# Patient Record
Sex: Male | Born: 1965 | Race: Black or African American | Hispanic: No | Marital: Single | Smoking: Current every day smoker
Health system: Southern US, Community
[De-identification: ages and names within clinical notes are randomized; demographics above are authoritative.]

## PROBLEM LIST (undated history)

## (undated) HISTORY — PX: HERNIA REPAIR: SHX51

---

## 1997-08-13 ENCOUNTER — Emergency Department (HOSPITAL_COMMUNITY): Admission: EM | Admit: 1997-08-13 | Discharge: 1997-08-13 | Payer: Self-pay | Admitting: Emergency Medicine

## 1997-08-15 ENCOUNTER — Emergency Department (HOSPITAL_COMMUNITY): Admission: EM | Admit: 1997-08-15 | Discharge: 1997-08-15 | Payer: Self-pay | Admitting: Emergency Medicine

## 2000-12-31 ENCOUNTER — Emergency Department (HOSPITAL_COMMUNITY): Admission: EM | Admit: 2000-12-31 | Discharge: 2000-12-31 | Payer: Self-pay | Admitting: Emergency Medicine

## 2010-12-10 ENCOUNTER — Emergency Department: Payer: Self-pay | Admitting: Emergency Medicine

## 2010-12-28 ENCOUNTER — Emergency Department: Payer: Self-pay | Admitting: Emergency Medicine

## 2012-05-14 IMAGING — CR DG CHEST 2V
1 series · 2 of 2 positions shown · non-contrast
Comparison: none

REASON FOR EXAM: chest discomfort
COMMENTS:

[Series 1: view not recorded · 0.17mm/px · 2 of 2 slices shown]
[im 1/2]
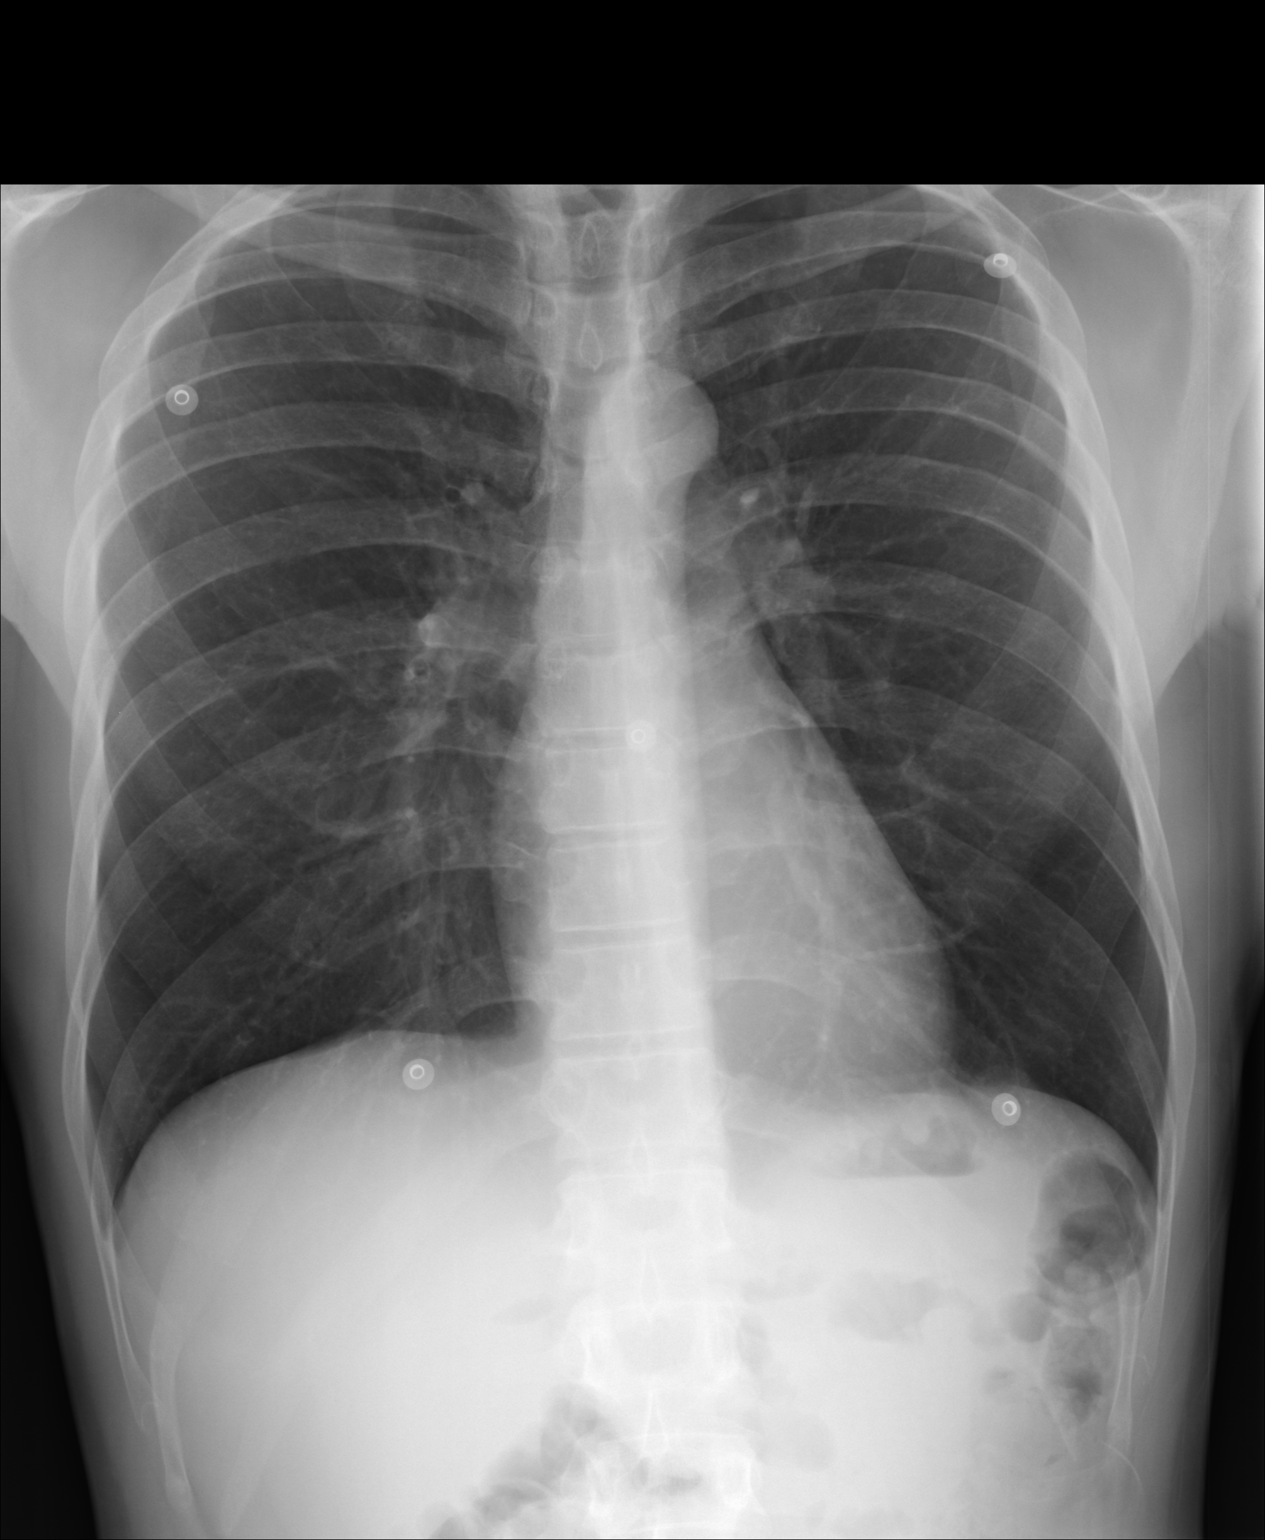
[im 2/2]
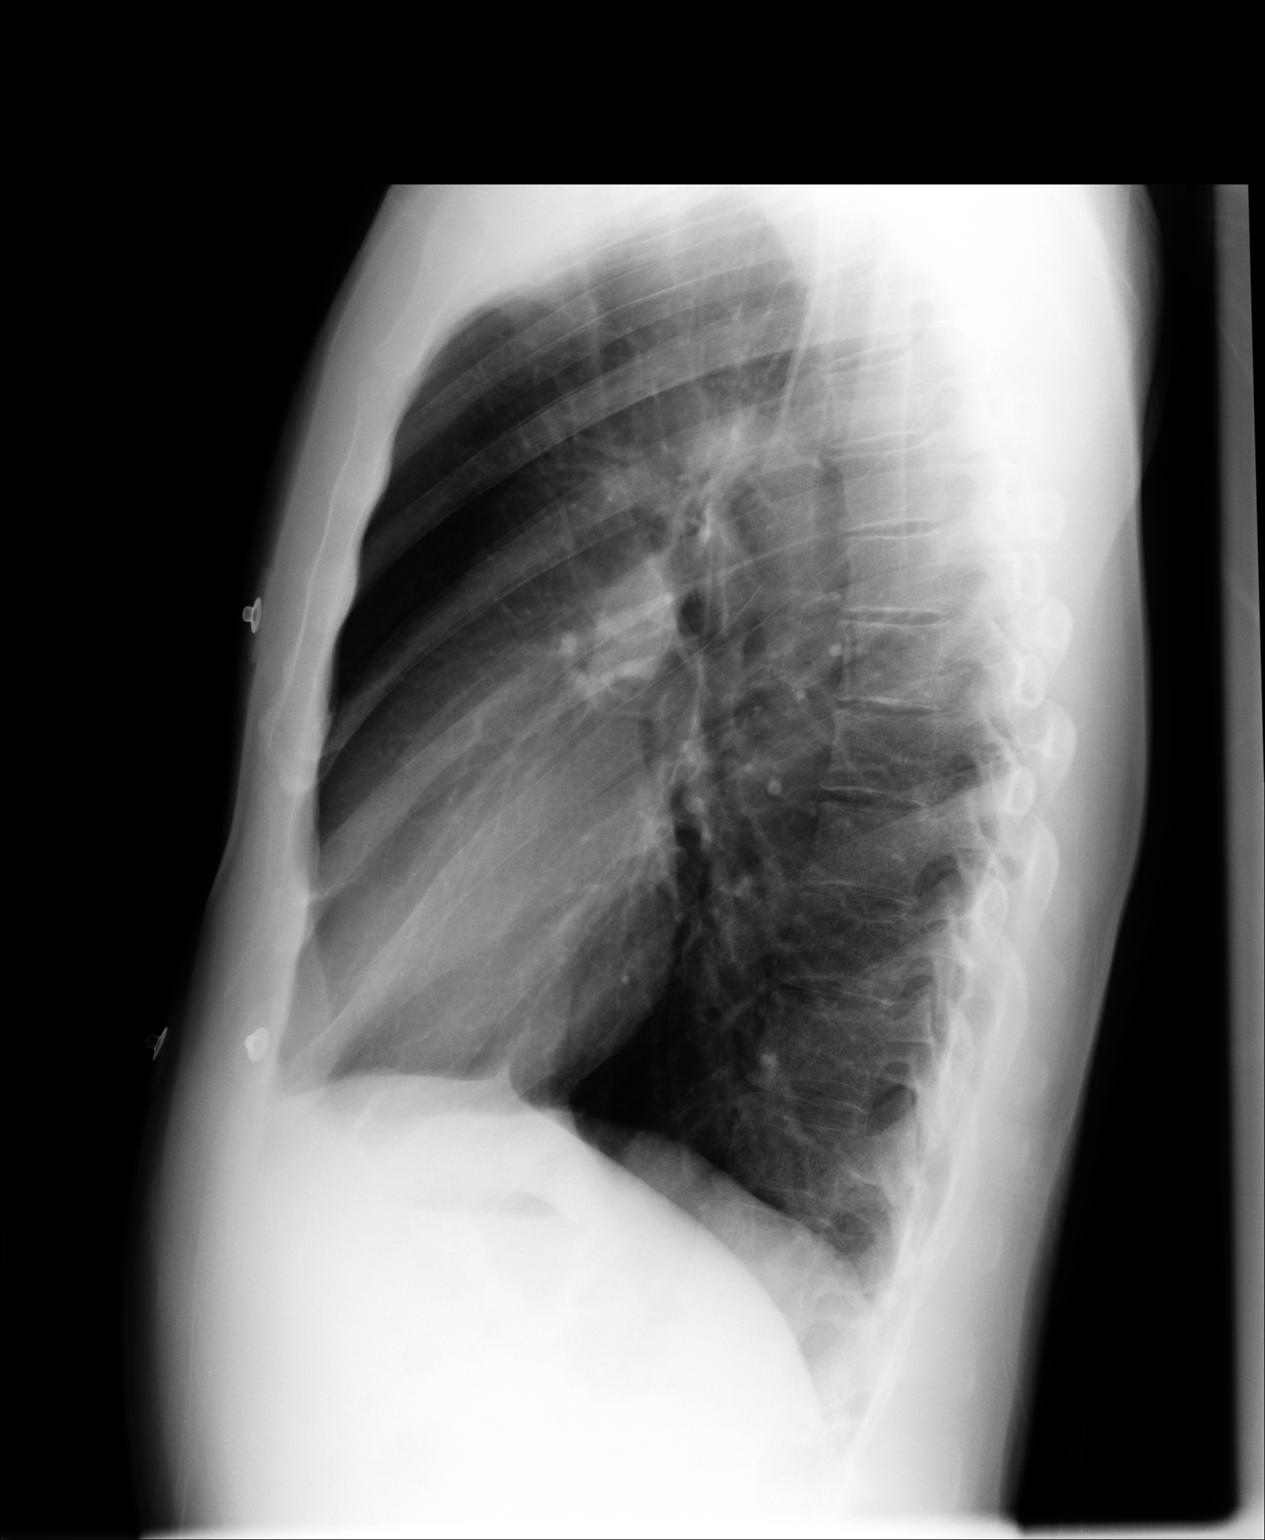

[2 of 2 positions shown; findings below may reference images not displayed]

PROCEDURE:     DXR - DXR CHEST PA (OR AP) AND LATERAL  - December 28, 2010 [DATE]

RESULT:     Comparison is made to the prior exam of 12/10/2010.

The lung fields are clear. The heart, mediastinal and osseous structures
reveal no significant abnormalities. The chest is bilaterally hyperinflated
compatible with a history of COPD.
IMPRESSION: 1.  The lung fields are clear.
2.  COPD.

## 2015-06-27 ENCOUNTER — Encounter: Payer: Self-pay | Admitting: Emergency Medicine

## 2015-06-27 ENCOUNTER — Emergency Department
Admission: EM | Admit: 2015-06-27 | Discharge: 2015-06-27 | Disposition: A | Discharge status: Home / Self Care | Payer: Self-pay | Attending: Emergency Medicine | Admitting: Emergency Medicine

## 2015-06-27 DIAGNOSIS — F17200 Nicotine dependence, unspecified, uncomplicated: Secondary | ICD-10-CM | POA: Insufficient documentation

## 2015-06-27 DIAGNOSIS — J988 Other specified respiratory disorders: Secondary | ICD-10-CM

## 2015-06-27 DIAGNOSIS — B9789 Other viral agents as the cause of diseases classified elsewhere: Secondary | ICD-10-CM

## 2015-06-27 DIAGNOSIS — J069 Acute upper respiratory infection, unspecified: Secondary | ICD-10-CM | POA: Insufficient documentation

## 2015-06-27 MED ORDER — FEXOFENADINE-PSEUDOEPHED ER 60-120 MG PO TB12: 1.0000 | ORAL_TABLET | Freq: Two times a day (BID) | ORAL | Status: DC

## 2015-06-27 MED ORDER — KETOROLAC TROMETHAMINE 60 MG/2ML IM SOLN
60.0000 mg | Freq: Once | INTRAMUSCULAR | Status: AC
  Administered 2015-06-27: 60 mg via INTRAMUSCULAR
  Filled 2015-06-27: qty 2

## 2015-06-27 MED ORDER — IBUPROFEN 800 MG PO TABS: 800.0000 mg | ORAL_TABLET | Freq: Three times a day (TID) | ORAL | Status: DC | PRN

## 2015-06-27 MED ORDER — HYDROCOD POLST-CPM POLST ER 10-8 MG/5ML PO SUER
5.0000 mL | Freq: Once | ORAL | Status: AC
  Administered 2015-06-27: 5 mL via ORAL
  Filled 2015-06-27: qty 5

## 2015-06-27 MED ORDER — HYDROCOD POLST-CPM POLST ER 10-8 MG/5ML PO SUER
5.0000 mL | Freq: Two times a day (BID) | ORAL | Status: DC
Start: 2015-06-27 — End: 2018-10-15

## 2015-06-27 NOTE — ED Notes (Signed)
Fever with body aches and sore throat for 2 days  No diff swallowing at present

## 2015-06-27 NOTE — ED Notes (Signed)
Pt to ed with c/o body aches, fever x 2 days, and sore throat.

## 2015-06-27 NOTE — Discharge Instructions (Signed)

## 2015-06-27 NOTE — ED Provider Notes (Signed)
[facility] Emergency Department Provider Note  ____________________________________________  Time seen: Approximately 10:46 AM  I have reviewed the triage vital signs and the nursing notes.   HISTORY  Chief Complaint Generalized Body Aches    HPI Hunter Clark is a 50 y.o. male patient complaining of fever bodyaches sore throat for 2 days. Patient also states she has sinus congestion and postnasal drainage. Patient denies any nausea vomiting diarrhea with this complaint. Patient state he took a flu shot this season. Patient rates his pain discomfort as a 10 over 10. No palliative measures taken for this complaint.   History reviewed. No pertinent past medical history.  There are no active problems to display for this patient.   History reviewed. No pertinent past surgical history.  Current Outpatient Rx  Name  Route  Sig  Dispense  Refill  . chlorpheniramine-HYDROcodone (TUSSIONEX PENNKINETIC ER) 10-8 MG/5ML SUER   Oral   Take 5 mLs by mouth 2 (two) times daily.   115 mL   0   . fexofenadine-pseudoephedrine (ALLEGRA-D) 60-120 MG 12 hr tablet   Oral   Take 1 tablet by mouth 2 (two) times daily.   20 tablet   0   . ibuprofen (ADVIL,MOTRIN) 800 MG tablet   Oral   Take 1 tablet (800 mg total) by mouth every 8 (eight) hours as needed.   30 tablet   0     Allergies Review of patient's allergies indicates no known allergies.  History reviewed. No pertinent family history.  Social History Social History  Substance Use Topics  . Smoking status: Current Every Day Smoker  . Smokeless tobacco: None  . Alcohol Use: No    Review of Systems Constitutional: Fever, chills, and body aches Eyes: No visual changes. ENT: Sore throat Cardiovascular: Denies chest pain. Respiratory: Denies shortness of breath. Gastrointestinal: No abdominal pain.  No nausea, no vomiting.  No diarrhea.  No constipation. Genitourinary: Negative for  dysuria. Musculoskeletal: Negative for back pain. Skin: Negative for rash. Neurological: Negative for headaches, focal weakness or numbness. 10-point ROS otherwise negative.  ____________________________________________   PHYSICAL EXAM:  VITAL SIGNS: ED Triage Vitals  Enc Vitals Group     BP 06/27/15 1011 120/75 mmHg     Pulse Rate 06/27/15 1011 98     Resp 06/27/15 1011 20     Temp 06/27/15 1011 100.2 F (37.9 C)     Temp Source 06/27/15 1011 Oral     SpO2 06/27/15 1011 100 %     Weight 06/27/15 1011 160 lb (72.576 kg)     Height 06/27/15 1011  (1.676 m)     Head Cir --      Peak Flow --      Pain Score 06/27/15 1011 10     Pain Loc --      Pain Edu? --      Excl. in GC? --     Constitutional: Alert and oriented. Well appearing and in no acute distress. Fever Eyes: Conjunctivae are normal. PERRL. EOMI. Head: Atraumatic. Nose: Edematous nasal turbinates with bilateral maxillary guarding. Mouth/Throat: Mucous membranes are moist.  Oropharynx erythematous non- exudative tonsils Neck: No stridor.  No cervical spine tenderness to palpation. Cardiovascular: Normal rate, regular rhythm. Grossly normal heart sounds.  Good peripheral circulation. Respiratory: Normal respiratory effort.  No retractions. Lungs CTAB. Gastrointestinal: Soft and nontender. No distention. No abdominal bruits. No CVA tenderness. Musculoskeletal: No lower extremity tenderness nor edema.  No joint effusions. Neurologic:  Normal speech and language. No gross focal neurologic deficits are appreciated. No gait instability. Skin:  Skin is warm, dry and intact. No rash noted. Psychiatric: Mood and affect are normal. Speech and behavior are normal.  ____________________________________________   LABS (all labs ordered are listed, but only abnormal results are displayed)  Labs Reviewed - No data to  display ____________________________________________  EKG   ____________________________________________  RADIOLOGY   ____________________________________________   PROCEDURES  Procedure(s) performed: None  Critical Care performed: No  ____________________________________________   INITIAL IMPRESSION / ASSESSMENT AND PLAN / ED COURSE  Pertinent labs & imaging results that were available during my care of the patient were reviewed by me and considered in my medical decision making (see chart for details).  Viral illness. Patient given discharge care instructions. Patient given a prescription for Allegra-D and Tussionex. Patient advised use Tylenol or Motrin for fever and body aches. ____________________________________________   FINAL CLINICAL IMPRESSION(S) / ED DIAGNOSES  Final diagnoses:  Viral respiratory illness      Joni Reining, PA-C 06/27/15 1052  Richardean Canal, MD 06/27/15 [phone]

## 2016-03-24 ENCOUNTER — Emergency Department
Admission: EM | Admit: 2016-03-24 | Discharge: 2016-03-24 | Disposition: A | Discharge status: Home / Self Care | Payer: Self-pay | Attending: Emergency Medicine | Admitting: Emergency Medicine

## 2016-03-24 ENCOUNTER — Encounter: Payer: Self-pay | Admitting: Emergency Medicine

## 2016-03-24 ENCOUNTER — Emergency Department: Payer: Self-pay

## 2016-03-24 DIAGNOSIS — F17200 Nicotine dependence, unspecified, uncomplicated: Secondary | ICD-10-CM | POA: Insufficient documentation

## 2016-03-24 DIAGNOSIS — R0789 Other chest pain: Secondary | ICD-10-CM | POA: Insufficient documentation

## 2016-03-24 DIAGNOSIS — R0600 Dyspnea, unspecified: Secondary | ICD-10-CM | POA: Insufficient documentation

## 2016-03-24 LAB — CBC
HCT: 40.6 % (ref 40.0–52.0)
HEMOGLOBIN: 13.8 g/dL (ref 13.0–18.0)
MCH: 32.8 pg (ref 26.0–34.0)
MCHC: 34 g/dL (ref 32.0–36.0)
MCV: 96.3 fL (ref 80.0–100.0)
PLATELETS: 248 10*3/uL (ref 150–440)
RBC: 4.21 MIL/uL — ABNORMAL LOW (ref 4.40–5.90)
RDW: 13.6 % (ref 11.5–14.5)
WBC: 6.2 10*3/uL (ref 3.8–10.6)

## 2016-03-24 LAB — TROPONIN I

## 2016-03-24 LAB — BASIC METABOLIC PANEL
Anion gap: 6 (ref 5–15)
BUN: 8 mg/dL (ref 6–20)
CALCIUM: 8.5 mg/dL — AB (ref 8.9–10.3)
CO2: 26 mmol/L (ref 22–32)
CREATININE: 1.16 mg/dL (ref 0.61–1.24)
Chloride: 107 mmol/L (ref 101–111)
GFR calc Af Amer: >60 mL/min (ref >60)
GFR calc non Af Amer: >60 mL/min (ref >60)
GLUCOSE: 114 mg/dL — AB (ref 65–99)
Potassium: 3.4 mmol/L — ABNORMAL LOW (ref 3.5–5.1)
Sodium: 139 mmol/L (ref 135–145)

## 2016-03-24 MED ORDER — LORAZEPAM 1 MG PO TABS
1.0000 mg | ORAL_TABLET | Freq: Once | ORAL | Status: AC
  Administered 2016-03-24: 1 mg via ORAL
  Filled 2016-03-24: qty 1

## 2016-03-24 MED ORDER — PREDNISONE 20 MG PO TABS: 40.0000 mg | ORAL_TABLET | Freq: Every day | ORAL | 0 refills | Status: DC

## 2016-03-24 MED ORDER — FAMOTIDINE 20 MG PO TABS
40.0000 mg | ORAL_TABLET | Freq: Once | ORAL | Status: AC
  Administered 2016-03-24: 40 mg via ORAL
  Filled 2016-03-24: qty 2

## 2016-03-24 MED ORDER — NAPROXEN 500 MG PO TABS: 500.0000 mg | ORAL_TABLET | Freq: Two times a day (BID) | ORAL | 0 refills | Status: DC

## 2016-03-24 MED ORDER — RANITIDINE HCL 150 MG PO CAPS: 150.0000 mg | ORAL_CAPSULE | Freq: Two times a day (BID) | ORAL | 0 refills | Status: DC

## 2016-03-24 MED ORDER — PREDNISONE 20 MG PO TABS
40.0000 mg | ORAL_TABLET | ORAL | Status: AC
  Administered 2016-03-24: 40 mg via ORAL
  Filled 2016-03-24: qty 2

## 2016-03-24 MED ORDER — ASPIRIN 81 MG PO CHEW
324.0000 mg | CHEWABLE_TABLET | Freq: Once | ORAL | Status: AC
  Administered 2016-03-24: 324 mg via ORAL
  Filled 2016-03-24: qty 4

## 2016-03-24 MED ORDER — KETOROLAC TROMETHAMINE 30 MG/ML IJ SOLN
15.0000 mg | INTRAMUSCULAR | Status: AC
  Administered 2016-03-24: 15 mg via INTRAVENOUS
  Filled 2016-03-24: qty 1

## 2016-03-24 NOTE — ED Notes (Signed)
Report from previous rn's. Pt states "i feel a lot better." call bell at side. Pt denies needs at this time. Vss.

## 2016-03-24 NOTE — ED Triage Notes (Signed)
Patient reports left chest pain and shortness of breath starting Friday night. Reports nausea and dizziness increasing over the past two days. Patient denies any history of heart disease for self or family. Patient also reports numbness to mouth and left arm. Patient is tearful in triage and states "I can't take a deep breath." Oxygen saturation 99% on room air upon arrival to ED.

## 2016-03-24 NOTE — ED Provider Notes (Signed)
[facility] Emergency Department Provider Note  ____________________________________________  Time seen: Approximately 7:04 PM  I have reviewed the triage vital signs and the nursing notes.   HISTORY  Chief Complaint Chest Pain    HPI Hunter Clark is a 50 y.o. male who complains of intermittent chest pain lasting about 20 seconds at a time in the left chest, aching, associated with shortness of breath and nausea but no vomiting or diaphoresis. It occurs at rest, no aggravating or alleviating factors. Not pleuritic, not exertional. Never had anything like this before. Denies any medical history. Has been occurring intermittently for the past 2 days. 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  No recent travel trauma hospitalizations or surgeries. No history of DVT or PE.   History reviewed. No pertinent past medical history.   There are no active problems to display for this patient.    Past Surgical History:  Procedure Laterality Date  . HERNIA REPAIR       Prior to Admission medications   Medication Sig Start Date End Date Taking? Authorizing Provider  chlorpheniramine-HYDROcodone (TUSSIONEX PENNKINETIC ER) 10-8 MG/5ML SUER Take 5 mLs by mouth 2 (two) times daily. 06/27/15   Joni Reiningonald K Smith, PA-C  fexofenadine-pseudoephedrine (ALLEGRA-D) 60-120 MG 12 hr tablet Take 1 tablet by mouth 2 (two) times daily. 06/27/15   Joni Reiningonald K Smith, PA-C  ibuprofen (ADVIL,MOTRIN) 800 MG tablet Take 1 tablet (800 mg total) by mouth every 8 (eight) hours as needed. 06/27/15   Joni Reiningonald K Smith, PA-C  naproxen (NAPROSYN) 500 MG tablet Take 1 tablet (500 mg total) by mouth 2 (two) times daily with a meal. 03/24/16   Sharman CheekPhillip Previn Jian, MD  predniSONE (DELTASONE) 20 MG tablet Take 2 tablets (40 mg total) by mouth daily. 03/24/16   Sharman CheekPhillip Hazelle Woollard, MD  ranitidine (ZANTAC) 150 MG capsule Take 1 capsule (150 mg total) by mouth 2 (two) times  daily. 03/24/16   Sharman CheekPhillip Mandee Pluta, MD     Allergies Patient has no known allergies.   No family history on file.  Social History Social History  Substance Use Topics  . Smoking status: Current Every Day Smoker    Packs/day: 0.50  . Smokeless tobacco: Never Used  . Alcohol use Yes     Comment: occasional    Review of Systems  Constitutional:   No fever or chills.  ENT:   No sore throat. No rhinorrhea. Cardiovascular:   Positive as above chest pain. Respiratory:   Intermittent dyspnea without cough. Gastrointestinal:   Negative for abdominal pain, vomiting and diarrhea.   Musculoskeletal:   Negative for focal pain or swelling 10-point ROS otherwise negative.  ____________________________________________   PHYSICAL EXAM:  VITAL SIGNS: ED Triage Vitals  Enc Vitals Group     BP 03/24/16 1710 (!) 158/94     Pulse Rate 03/24/16 1710 72     Resp 03/24/16 1710 14     Temp 03/24/16 1710 98.3 F (36.8 C)     Temp Source 03/24/16 1710 Oral     SpO2 03/24/16 1710 99 %     Weight 03/24/16 1711 175 lb (79.4 kg)     Height 03/24/16 1711 5\' 6"  (1.676 m)     Head Circumference --      Peak Flow --      Pain Score 03/24/16 1711 10     Pain Loc --      Pain Edu? --  Excl. in GC? --     Vital signs reviewed, nursing assessments reviewed.   Constitutional:   Alert and oriented. Well appearing and in no distress. Eyes:   No scleral icterus. No conjunctival pallor. PERRL. EOMI.  No nystagmus. ENT   Head:   Normocephalic and atraumatic.   Nose:   No congestion/rhinnorhea. No septal hematoma   Mouth/Throat:   MMM, no pharyngeal erythema. No peritonsillar mass.    Neck:   No stridor. No SubQ emphysema. No meningismus. Hematological/Lymphatic/Immunilogical:   No cervical lymphadenopathy. Cardiovascular:   RRR. Symmetric bilateral radial and DP pulses.  No murmurs.  Respiratory:   Normal respiratory effort without tachypnea nor retractions. Breath sounds are  clear and equal bilaterally. No wheezes/rales/rhonchi.Left chest wall tender which reproduces his pain. Gastrointestinal:   Soft with mild left upper quadrant tenderness. Non distended. There is no CVA tenderness.  No rebound, rigidity, or guarding. Genitourinary:   deferred Musculoskeletal:   Nontender with normal range of motion in all extremities. No joint effusions.  No lower extremity tenderness.  No edema. Neurologic:   Normal speech and language.  CN 2-10 normal. Motor grossly intact. No gross focal neurologic deficits are appreciated.  Skin:    Skin is warm, dry and intact. No rash noted.  No petechiae, purpura, or bullae. No crepitus  ____________________________________________    LABS (pertinent positives/negatives) (all labs ordered are listed, but only abnormal results are displayed) Labs Reviewed  BASIC METABOLIC PANEL - Abnormal; Notable for the following:       Result Value   Potassium 3.4 (*)    Glucose, Bld 114 (*)    Calcium 8.5 (*)    All other components within normal limits  CBC - Abnormal; Notable for the following:    RBC 4.21 (*)    All other components within normal limits  TROPONIN I   ____________________________________________   EKG  Interpreted by me Normal sinus rhythm rate of 73, normal axis and intervals. Normal QRS ST segments. There is an isolated T-wave inversion in lead 3 which is nonspecific. No evidence of right heart strain.  ____________________________________________    RADIOLOGY  Chest x-ray unremarkable  ____________________________________________   PROCEDURES Procedures  ____________________________________________   INITIAL IMPRESSION / ASSESSMENT AND PLAN / ED COURSE  Pertinent labs & imaging results that were available during my care of the patient were reviewed by me and considered in my medical decision making (see chart for details).  Patient not in distress, presents with atypical chest pain, on exam it is  a Reproducible chest wall pain. He seems to have an element of gastritis and chest wall tenderness suggestive of costochondritis. Overall likely a viral illness. 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  EKG chest x-ray and labs are all unremarkable. Symptoms have resolved after giving a few medications to treat his pain including anti-inflammatories, H2 blocker, and oral Ativan. I'll continue him on NSAIDs H2 blocker and prednisone for a few days. Work note provided.Considering the patient's symptoms, medical history, and physical examination today, I have low suspicion for ACS, PE, TAD, pneumothorax, carditis, mediastinitis, pneumonia, CHF, or sepsis.       Clinical Course    ____________________________________________   FINAL CLINICAL IMPRESSION(S) / ED DIAGNOSES  Final diagnoses:  Chest wall pain       Portions of this note were generated with dragon dictation software. Dictation errors may occur despite best attempts at proofreading.    Sharman CheekPhillip Orvetta Danielski, MD 03/24/16 [id number]

## 2016-11-21 ENCOUNTER — Emergency Department
Admission: EM | Admit: 2016-11-21 | Discharge: 2016-11-21 | Disposition: A | Discharge status: Home / Self Care | Payer: Self-pay | Attending: Student in an Organized Health Care Education/Training Program | Admitting: Student in an Organized Health Care Education/Training Program

## 2016-11-21 DIAGNOSIS — R51 Headache: Secondary | ICD-10-CM | POA: Insufficient documentation

## 2016-11-21 DIAGNOSIS — R519 Headache, unspecified: Secondary | ICD-10-CM

## 2016-11-21 DIAGNOSIS — M791 Myalgia, unspecified site: Secondary | ICD-10-CM

## 2016-11-21 DIAGNOSIS — Z79899 Other long term (current) drug therapy: Secondary | ICD-10-CM | POA: Insufficient documentation

## 2016-11-21 DIAGNOSIS — F17200 Nicotine dependence, unspecified, uncomplicated: Secondary | ICD-10-CM | POA: Insufficient documentation

## 2016-11-21 DIAGNOSIS — Z791 Long term (current) use of non-steroidal anti-inflammatories (NSAID): Secondary | ICD-10-CM | POA: Insufficient documentation

## 2016-11-21 LAB — URINALYSIS, COMPLETE (UACMP) WITH MICROSCOPIC
Bilirubin Urine: NEGATIVE
Glucose, UA: NEGATIVE mg/dL
KETONES UR: NEGATIVE mg/dL
Leukocytes, UA: NEGATIVE
Nitrite: NEGATIVE
PROTEIN: NEGATIVE mg/dL
Specific Gravity, Urine: 1.031 — ABNORMAL HIGH (ref 1.005–1.030)
pH: 5 (ref 5.0–8.0)

## 2016-11-21 LAB — CBC WITH DIFFERENTIAL/PLATELET
Basophils Absolute: 0.1 10*3/uL (ref 0–0.1)
Basophils Relative: 1 %
EOS ABS: 0.2 10*3/uL (ref 0–0.7)
EOS PCT: 2 %
HCT: 43 % (ref 40.0–52.0)
HEMOGLOBIN: 14.5 g/dL (ref 13.0–18.0)
LYMPHS ABS: 2.5 10*3/uL (ref 1.0–3.6)
LYMPHS PCT: 31 %
MCH: 32.5 pg (ref 26.0–34.0)
MCHC: 33.7 g/dL (ref 32.0–36.0)
MCV: 96.6 fL (ref 80.0–100.0)
MONOS PCT: 8 %
Monocytes Absolute: 0.6 10*3/uL (ref 0.2–1.0)
Neutro Abs: 4.7 10*3/uL (ref 1.4–6.5)
Neutrophils Relative %: 58 %
PLATELETS: 272 10*3/uL (ref 150–440)
RBC: 4.45 MIL/uL (ref 4.40–5.90)
RDW: 13.4 % (ref 11.5–14.5)
WBC: 8.1 10*3/uL (ref 3.8–10.6)

## 2016-11-21 LAB — COMPREHENSIVE METABOLIC PANEL
ALBUMIN: 4.2 g/dL (ref 3.5–5.0)
ALT: 13 U/L — AB (ref 17–63)
AST: 29 U/L (ref 15–41)
Alkaline Phosphatase: 41 U/L (ref 38–126)
Anion gap: 8 (ref 5–15)
BUN: 10 mg/dL (ref 6–20)
CHLORIDE: 105 mmol/L (ref 101–111)
CO2: 26 mmol/L (ref 22–32)
CREATININE: 1.1 mg/dL (ref 0.61–1.24)
Calcium: 9.1 mg/dL (ref 8.9–10.3)
GFR calc Af Amer: >60 mL/min (ref >60)
Glucose, Bld: 102 mg/dL — ABNORMAL HIGH (ref 65–99)
POTASSIUM: 3.2 mmol/L — AB (ref 3.5–5.1)
SODIUM: 139 mmol/L (ref 135–145)
Total Bilirubin: 0.5 mg/dL (ref 0.3–1.2)
Total Protein: 7.6 g/dL (ref 6.5–8.1)

## 2016-11-21 MED ORDER — DEXAMETHASONE SODIUM PHOSPHATE 10 MG/ML IJ SOLN
10.0000 mg | Freq: Once | INTRAMUSCULAR | Status: AC
  Administered 2016-11-21: 10 mg via INTRAMUSCULAR
  Filled 2016-11-21 (×2): qty 1

## 2016-11-21 MED ORDER — BUTALBITAL-APAP-CAFFEINE 50-325-40 MG PO TABS: 1.0000 | ORAL_TABLET | Freq: Four times a day (QID) | ORAL | 0 refills | Status: AC | PRN

## 2016-11-21 MED ORDER — PROCHLORPERAZINE EDISYLATE 5 MG/ML IJ SOLN
10.0000 mg | Freq: Once | INTRAMUSCULAR | Status: AC
  Administered 2016-11-21: 10 mg via INTRAMUSCULAR
  Filled 2016-11-21 (×2): qty 2

## 2016-11-21 NOTE — Discharge Instructions (Signed)

## 2016-11-21 NOTE — ED Provider Notes (Signed)
[facility] Emergency Department Provider Note    None    (approximate)  I have reviewed the triage vital signs and the nursing notes.   HISTORY  Chief Complaint Headache and Generalized Body Aches    HPI Hunter Clark is a 51 y.o. male presents with 3 days of migrating headache and myalgias. Patient states he's been working long hours at a [employer] and they have been struggling with a heat. Patient was just ago and today but his boss told to call out. Has had a few episodes of loose stool. States he did try to drink water to stay hydrated. States that the headache is been migrating down into his back. No fevers. No chest pain or shortness of breath. No numbness or tingling. Does not worse headache of his life. It was gradual in onset.   No past medical history on file. No family history on file. Past Surgical History:  Procedure Laterality Date  . HERNIA REPAIR     There are no active problems to display for this patient.     Prior to Admission medications   Medication Sig Start Date End Date Taking? Authorizing Provider  chlorpheniramine-HYDROcodone (TUSSIONEX PENNKINETIC ER) 10-8 MG/5ML SUER Take 5 mLs by mouth 2 (two) times daily. 06/27/15   Joni Reining, PA-C  fexofenadine-pseudoephedrine (ALLEGRA-D) 60-120 MG 12 hr tablet Take 1 tablet by mouth 2 (two) times daily. 06/27/15   Joni Reining, PA-C  ibuprofen (ADVIL,MOTRIN) 800 MG tablet Take 1 tablet (800 mg total) by mouth every 8 (eight) hours as needed. 06/27/15   Joni Reining, PA-C  naproxen (NAPROSYN) 500 MG tablet Take 1 tablet (500 mg total) by mouth 2 (two) times daily with a meal. 03/24/16   Sharman Cheek, MD  predniSONE (DELTASONE) 20 MG tablet Take 2 tablets (40 mg total) by mouth daily. 03/24/16   Sharman Cheek, MD  ranitidine (ZANTAC) 150 MG capsule Take 1 capsule (150 mg total) by mouth 2 (two) times daily. 03/24/16   Sharman Cheek, MD    Allergies Patient  has no known allergies.    Social History Social History  Substance Use Topics  . Smoking status: Current Every Day Smoker    Packs/day: 0.50  . Smokeless tobacco: Never Used  . Alcohol use Yes     Comment: occasional    Review of Systems Patient denies headaches, rhinorrhea, blurry vision, numbness, shortness of breath, chest pain, edema, cough, abdominal pain, nausea, vomiting, diarrhea, dysuria, fevers, rashes or hallucinations unless otherwise stated above in HPI. ____________________________________________   PHYSICAL EXAM:  VITAL SIGNS: Vitals:   11/21/16 2010  BP: (!) 147/83  Pulse: 82  Resp: 18  Temp: 98.8 F (37.1 C)    Constitutional: Alert and oriented. Well appearing and in no acute distress. Eyes: Conjunctivae are normal.  Head: Atraumatic. Nose: No congestion/rhinnorhea. Mouth/Throat: Mucous membranes are moist.   Neck: No stridor. Painless ROM.  Cardiovascular: Normal rate, regular rhythm. Grossly normal heart sounds.  Good peripheral circulation. Respiratory: Normal respiratory effort.  No retractions. Lungs CTAB. Gastrointestinal: Soft and nontender. No distention. No abdominal bruits. No CVA tenderness. Genitourinary:  Musculoskeletal: No lower extremity tenderness nor edema.  No joint effusions. Neurologic:  CN- intact.  No facial droop, Normal FNF.  Normal heel to shin.  Sensation intact bilaterally. Normal speech and language. No gross focal neurologic deficits are appreciated. No gait instability. Skin:  Skin is warm, dry and intact. No rash noted. Psychiatric: Mood and affect are  normal. Speech and behavior are normal.  ____________________________________________   LABS (all labs ordered are listed, but only abnormal results are displayed)  Results for orders placed or performed during the hospital encounter of 11/21/16 (from the past 24 hour(s))  Comprehensive metabolic panel     Status: Abnormal   Collection Time: 11/21/16  8:14 PM    Result Value Ref Range   Sodium 139 135 - 145 mmol/L   Potassium 3.2 (L) 3.5 - 5.1 mmol/L   Chloride 105 101 - 111 mmol/L   CO2 26 22 - 32 mmol/L   Glucose, Bld 102 (H) 65 - 99 mg/dL   BUN 10 6 - 20 mg/dL   Creatinine, Ser 1.611.10 0.61 - 1.24 mg/dL   Calcium 9.1 8.9 - 09.610.3 mg/dL   Total Protein 7.6 6.5 - 8.1 g/dL   Albumin 4.2 3.5 - 5.0 g/dL   AST 29 15 - 41 U/L   ALT 13 (L) 17 - 63 U/L   Alkaline Phosphatase 41 38 - 126 U/L   Total Bilirubin 0.5 0.3 - 1.2 mg/dL   GFR calc non Af Amer >60 >60 mL/min   GFR calc Af Amer >60 >60 mL/min   Anion gap 8 5 - 15  CBC with Differential     Status: None   Collection Time: 11/21/16  8:14 PM  Result Value Ref Range   WBC 8.1 3.8 - 10.6 K/uL   RBC 4.45 4.40 - 5.90 MIL/uL   Hemoglobin 14.5 13.0 - 18.0 g/dL   HCT 04.543.0 40.940.0 - 81.152.0 %   MCV 96.6 80.0 - 100.0 fL   MCH 32.5 26.0 - 34.0 pg   MCHC 33.7 32.0 - 36.0 g/dL   RDW 91.413.4 78.211.5 - 95.614.5 %   Platelets 272 150 - 440 K/uL   Neutrophils Relative % 58 %   Neutro Abs 4.7 1.4 - 6.5 K/uL   Lymphocytes Relative 31 %   Lymphs Abs 2.5 1.0 - 3.6 K/uL   Monocytes Relative 8 %   Monocytes Absolute 0.6 0.2 - 1.0 K/uL   Eosinophils Relative 2 %   Eosinophils Absolute 0.2 0 - 0.7 K/uL   Basophils Relative 1 %   Basophils Absolute 0.1 0 - 0.1 K/uL  Urinalysis, Complete w Microscopic     Status: Abnormal   Collection Time: 11/21/16  8:14 PM  Result Value Ref Range   Color, Urine YELLOW (A) YELLOW   APPearance CLEAR (A) CLEAR   Specific Gravity, Urine 1.031 (H) 1.005 - 1.030   pH 5.0 5.0 - 8.0   Glucose, UA NEGATIVE NEGATIVE mg/dL   Hgb urine dipstick SMALL (A) NEGATIVE   Bilirubin Urine NEGATIVE NEGATIVE   Ketones, ur NEGATIVE NEGATIVE mg/dL   Protein, ur NEGATIVE NEGATIVE mg/dL   Nitrite NEGATIVE NEGATIVE   Leukocytes, UA NEGATIVE NEGATIVE   RBC / HPF 0-5 0 - 5 RBC/hpf   WBC, UA 0-5 0 - 5 WBC/hpf   Bacteria, UA RARE (A) NONE SEEN   Squamous Epithelial / LPF 0-5 (A) NONE SEEN   Mucous PRESENT     ____________________________________________  ____________________________________________  RADIOLOGY   ____________________________________________   PROCEDURES  Procedure(s) performed:  Procedures    Critical Care performed: no ____________________________________________   INITIAL IMPRESSION / ASSESSMENT AND PLAN / ED COURSE  Pertinent labs & imaging results that were available during my care of the patient were reviewed by me and considered in my medical decision making (see chart for details).  DDX: tension, dehydration, migraine, cluster  DONTELL MIAN is a 51 y.o. who presents to the ED with with HA for last 3 days. Not worst HA ever. Gradual onset. Denies focal neurologic symptoms. Denies trauma. No fevers or neck pain. No vision loss. Afebrile in ED. VSS. Exam as above. No meningeal signs. No CN, motor, sensory or cerebellar deficits. Temporal arteries palpable and non-tender. Appears well and non-toxic.  Will provide IM medications for symptom control.  Likely tension, non-specific or possible migraine HA.  Possible component of heat illness. Clinical picture is not consistent with ICH, SAH, SDH, EDH, TIA, or CVA. No concern for meningitis or encephalitis. No concern for GCA/Temporal arteritis.  ----------------------------------------- 9:42 PM on 11/21/2016 -----------------------------------------   Pain improved. Repeat neuro exam is again without focal deficit, nuchal rigidity or evidence of meningeal irritation.  Stable to D/C home, follow up with PCP or Neurology if persistent recurrent Has.  Have discussed with the patient and available family all diagnostics and treatments performed thus far and all questions were answered to the best of my ability. The patient demonstrates understanding and agreement with plan.        ____________________________________________   FINAL CLINICAL IMPRESSION(S) / ED DIAGNOSES  Final diagnoses:  Bad headache   Muscle ache      NEW MEDICATIONS STARTED DURING THIS VISIT:  New Prescriptions   No medications on file     Note:  This document was prepared using Dragon voice recognition software and may include unintentional dictation errors.    Willy Eddy, MD 11/21/16 [phone]

## 2016-11-21 NOTE — ED Triage Notes (Signed)
Pt ambulatory to triage in NAD, report headache, general body aches and malaise x 3 days, states has had diarrhea as well.

## 2016-11-21 NOTE — ED Notes (Signed)
Pt verbalized understanding of discharge and instructions. E-signature pad not in working order.

## 2018-10-09 ENCOUNTER — Other Ambulatory Visit: Payer: Self-pay

## 2018-10-09 DIAGNOSIS — Z5321 Procedure and treatment not carried out due to patient leaving prior to being seen by health care provider: Secondary | ICD-10-CM | POA: Insufficient documentation

## 2018-10-09 DIAGNOSIS — R079 Chest pain, unspecified: Secondary | ICD-10-CM | POA: Insufficient documentation

## 2018-10-09 LAB — CBC
HCT: 42.6 % (ref 39.0–52.0)
Hemoglobin: 14.6 g/dL (ref 13.0–17.0)
MCH: 31.7 pg (ref 26.0–34.0)
MCHC: 34.3 g/dL (ref 30.0–36.0)
MCV: 92.4 fL (ref 80.0–100.0)
Platelets: 291 10*3/uL (ref 150–400)
RBC: 4.61 MIL/uL (ref 4.22–5.81)
RDW: 12.8 % (ref 11.5–15.5)
WBC: 8.8 10*3/uL (ref 4.0–10.5)
nRBC: 0 % (ref 0.0–0.2)

## 2018-10-09 LAB — BASIC METABOLIC PANEL
Anion gap: 11 (ref 5–15)
BUN: 17 mg/dL (ref 6–20)
CO2: 24 mmol/L (ref 22–32)
Calcium: 8.9 mg/dL (ref 8.9–10.3)
Chloride: 103 mmol/L (ref 98–111)
Creatinine, Ser: 1.44 mg/dL — ABNORMAL HIGH (ref 0.61–1.24)
GFR calc Af Amer: >60 mL/min (ref >60)
GFR calc non Af Amer: 56 mL/min — ABNORMAL LOW (ref >60)
Glucose, Bld: 105 mg/dL — ABNORMAL HIGH (ref 70–99)
Potassium: 3.7 mmol/L (ref 3.5–5.1)
Sodium: 138 mmol/L (ref 135–145)

## 2018-10-09 LAB — TROPONIN I: Troponin I: <0.03 ng/mL (ref <0.03)

## 2018-10-09 NOTE — ED Triage Notes (Signed)
Patient reports having chest pain off/on for 3 weeks.  Reports pain in his arms has been constant and the pain in his chest is getting worse.

## 2018-10-10 ENCOUNTER — Emergency Department
Admission: EM | Admit: 2018-10-10 | Discharge: 2018-10-10 | Disposition: A | Discharge status: Home / Self Care | Payer: BC Managed Care – PPO | Attending: Emergency Medicine | Admitting: Emergency Medicine

## 2018-10-12 ENCOUNTER — Telehealth: Payer: Self-pay | Admitting: Emergency Medicine

## 2018-10-12 NOTE — Telephone Encounter (Signed)
Called patient due to lwot to inquire about condition and follow up plans. Said call could not be completed.

## 2018-10-15 ENCOUNTER — Encounter: Payer: Self-pay | Admitting: Emergency Medicine

## 2018-10-15 ENCOUNTER — Emergency Department
Admission: EM | Admit: 2018-10-15 | Discharge: 2018-10-15 | Disposition: A | Discharge status: Home / Self Care | Payer: BC Managed Care – PPO | Attending: Emergency Medicine | Admitting: Emergency Medicine

## 2018-10-15 ENCOUNTER — Emergency Department: Payer: BC Managed Care – PPO

## 2018-10-15 ENCOUNTER — Other Ambulatory Visit: Payer: Self-pay

## 2018-10-15 DIAGNOSIS — Y939 Activity, unspecified: Secondary | ICD-10-CM | POA: Diagnosis not present

## 2018-10-15 DIAGNOSIS — M7022 Olecranon bursitis, left elbow: Secondary | ICD-10-CM | POA: Insufficient documentation

## 2018-10-15 DIAGNOSIS — M79602 Pain in left arm: Secondary | ICD-10-CM | POA: Diagnosis present

## 2018-10-15 DIAGNOSIS — F17200 Nicotine dependence, unspecified, uncomplicated: Secondary | ICD-10-CM | POA: Insufficient documentation

## 2018-10-15 MED ORDER — NAPROXEN 500 MG PO TABS: 500.0000 mg | ORAL_TABLET | Freq: Two times a day (BID) | ORAL | Status: DC

## 2018-10-15 MED ORDER — TRAMADOL HCL 50 MG PO TABS: 50.0000 mg | ORAL_TABLET | Freq: Two times a day (BID) | ORAL | 0 refills | Status: DC | PRN

## 2018-10-15 NOTE — ED Provider Notes (Signed)
  [facility] EMERGENCY DEPARTMENT Provider Note   CSN: [id number] Arrival date & time: 10/15/18  0539     History   Chief Complaint Chief Complaint  Patient presents with  . Arm Pain    HPI Hunter Clark is a 53 y.o. male.     HPI Patient presents with left elbow pain and mild edema secondary to contusion by door 2 weeks ago.  Patient states decreased range of motion with extension limited by complaint of pain.  Patient rates his pain as a 6/10.  Patient described pain is "achy/sharp".  Patient is wearing a makeshift sling for comfort. History reviewed. No pertinent past medical history.  There are no active problems to display for this patient.   Past Surgical History:  Procedure Laterality Date  . HERNIA REPAIR          Home Medications    Prior to Admission medications   Not on File    Family History No family history on file.  Social History Social History   Tobacco Use  . Smoking status: Current Every Day Smoker    Packs/day: 0.50  . Smokeless tobacco: Never Used  Substance Use Topics  . Alcohol use: Yes    Comment: occasional  . Drug use: Yes    Types: Marijuana     Allergies   Patient has no known allergies.   Review of Systems Review of Systems   Physical Exam Updated Vital Signs BP (!) 166/97 (BP Location: Right Arm)   Pulse 91   Temp 98.1 F (36.7 C) (Oral)   Resp 18   Ht 5\' 6"  (1.676 m)   Wt 81.6 kg   SpO2 98%   BMI 29.05 kg/m   Physical Exam No obvious deformity to the left elbow.  Patient has moderate guarding with palpation olecranon process.  Patient has decreased range of motion with extension.  Neurovascular intact.  ED Treatments / Results  Labs (all labs ordered are listed, but only abnormal results are displayed) Labs Reviewed - No data to display  EKG    Radiology No results found. No acute findings on x-ray of the left elbow. Procedures Procedures (including critical care time)   Medications Ordered in ED Medications - No data to display   Initial Impression / Assessment and Plan / ED Course  I have reviewed the triage vital signs and the nursing notes.  Pertinent labs & imaging results that were available during my care of the patient were reviewed by me and considered in my medical decision making (see chart for details).        Left elbow pain secondary to olecranon bursitis.  Patient given discharge care instruction advised take medication as directed.  Advised to wear arm sling for 3 to 5 days as needed.  Patient advised follow orthopedic if no improvement in 1 week.  Final Clinical Impressions(s) / ED Diagnoses   Final diagnoses:  None   Olecranon bursitis ED Discharge Orders    None       Hewitt Blade 10/15/18 0809    Carrie Mew, MD 10/15/18 1437

## 2018-10-15 NOTE — ED Notes (Signed)
See triage note  Presents with left elbow pain  States he hit his elbow about 2 weeks ago  States he was using OTC IBU with min to no relief

## 2018-10-15 NOTE — ED Triage Notes (Signed)
Patient ambulatory to triage with steady gait, without difficulty or distress noted, mask in place; pt reports left elbow pain after "hitting it on a door" 2wks ago; pt is accomp by fiance who keeps telling pt "don't let them draw no blood"

## 2018-10-15 NOTE — Discharge Instructions (Signed)
Follow discharge care instructions, wear sling, take medication as directed.

## 2018-11-04 ENCOUNTER — Other Ambulatory Visit: Payer: Self-pay | Admitting: Orthopedic Surgery

## 2018-11-04 DIAGNOSIS — M5412 Radiculopathy, cervical region: Secondary | ICD-10-CM

## 2018-11-14 ENCOUNTER — Ambulatory Visit
Admission: RE | Admit: 2018-11-14 | Discharge: 2018-11-14 | Disposition: A | Discharge status: Home / Self Care | Payer: BC Managed Care – PPO | Source: Ambulatory Visit | Attending: Orthopedic Surgery | Admitting: Orthopedic Surgery

## 2018-11-14 ENCOUNTER — Other Ambulatory Visit: Payer: Self-pay

## 2018-11-14 DIAGNOSIS — M5412 Radiculopathy, cervical region: Secondary | ICD-10-CM | POA: Insufficient documentation

## 2019-02-25 ENCOUNTER — Other Ambulatory Visit: Payer: Self-pay

## 2019-02-25 DIAGNOSIS — Z20822 Contact with and (suspected) exposure to covid-19: Secondary | ICD-10-CM

## 2019-02-27 LAB — NOVEL CORONAVIRUS, NAA: SARS-CoV-2, NAA: NOT DETECTED

## 2019-03-02 ENCOUNTER — Telehealth: Payer: Self-pay | Admitting: General Practice

## 2019-03-02 NOTE — Telephone Encounter (Signed)
Gave patient negative covid test results Patient understood 

## 2019-04-07 ENCOUNTER — Other Ambulatory Visit: Payer: Self-pay

## 2019-04-07 DIAGNOSIS — Z20822 Contact with and (suspected) exposure to covid-19: Secondary | ICD-10-CM

## 2019-04-09 LAB — NOVEL CORONAVIRUS, NAA: SARS-CoV-2, NAA: NOT DETECTED

## 2019-12-02 ENCOUNTER — Encounter: Payer: Self-pay | Admitting: Emergency Medicine

## 2019-12-02 ENCOUNTER — Emergency Department
Admission: EM | Admit: 2019-12-02 | Discharge: 2019-12-02 | Disposition: A | Discharge status: Home / Self Care | Payer: BC Managed Care – PPO | Attending: Emergency Medicine | Admitting: Emergency Medicine

## 2019-12-02 ENCOUNTER — Other Ambulatory Visit: Payer: Self-pay

## 2019-12-02 DIAGNOSIS — Z77098 Contact with and (suspected) exposure to other hazardous, chiefly nonmedicinal, chemicals: Secondary | ICD-10-CM | POA: Insufficient documentation

## 2019-12-02 DIAGNOSIS — F17210 Nicotine dependence, cigarettes, uncomplicated: Secondary | ICD-10-CM | POA: Diagnosis not present

## 2019-12-02 MED ORDER — HYDROCORTISONE 0.5 % EX CREA: 1.0000 "application " | TOPICAL_CREAM | Freq: Two times a day (BID) | CUTANEOUS | 0 refills | Status: AC

## 2019-12-02 NOTE — ED Notes (Signed)
See triage note  Presents post chemical exposure    States he had some Butyl Acrylate spill on to arms and abd  States he was sent thur decon shower for 15 mins PTA

## 2019-12-02 NOTE — ED Provider Notes (Signed)
[facility] Emergency Department Provider Note  ____________________________________________  Time seen: Approximately 10:18 AM  I have reviewed the triage vital signs and the nursing notes.   HISTORY  Chief Complaint Chemical Exposure    HPI Hunter Clark is a 54 y.o. male that presents to the emergency department for evaluation of splash of butyl acrylate to his left forearm and his abdomen.  Patient states that it was in a bay pose where it was not supposed to be and splashed back at him.  He was sent to the [facility] shower for 15 minutes.  He has changed his close.  He states that his forearm still feels burning "like little bees."  He did not inhale any chemical or get any on his face.  He was wearing his uniform and his safety gear.  Incident happened around 830-9:00.  History reviewed. No pertinent past medical history.  There are no problems to display for this patient.   Past Surgical History:  Procedure Laterality Date  . HERNIA REPAIR      Prior to Admission medications   Medication Sig Start Date End Date Taking? Authorizing Provider  gabapentin (NEURONTIN) 300 MG capsule Take 300 mg by mouth 3 (three) times daily.   Yes [provider]  hydrocortisone cream 0.5 % Apply 1 application topically 2 (two) times daily. 12/02/19   Enid Derry, PA-C    Allergies Patient has no known allergies.  History reviewed. No pertinent family history.  Social History Social History   Tobacco Use  . Smoking status: Current Every Day Smoker    Packs/day: 0.50  . Smokeless tobacco: Never Used  Substance Use Topics  . Alcohol use: Yes    Comment: occasional  . Drug use: Yes    Types: Marijuana     Review of Systems  Constitutional: No fever/chills Cardiovascular: No chest pain. Respiratory: No SOB. Gastrointestinal: No abdominal pain.  No nausea, no vomiting.  Musculoskeletal: Negative for musculoskeletal pain. Skin: Negative for  rash, abrasions, lacerations, ecchymosis. Neurological: Negative for headaches   ____________________________________________   PHYSICAL EXAM:  VITAL SIGNS: ED Triage Vitals  Enc Vitals Group     BP 12/02/19 0946 (!) 150/91     Pulse Rate 12/02/19 0946 92     Resp 12/02/19 0946 20     Temp 12/02/19 0946 98.4 F (36.9 C)     Temp Source 12/02/19 0946 Oral     SpO2 12/02/19 0946 99 %     Weight 12/02/19 0949 180 lb (81.6 kg)     Height 12/02/19 0949 5\' 6"  (1.676 m)     Head Circumference --      Peak Flow --      Pain Score 12/02/19 0949 6     Pain Loc --      Pain Edu? --      Excl. in GC? --      Constitutional: Alert and oriented. Well appearing and in no acute distress. Eyes: Conjunctivae are normal. PERRL. EOMI. Head: Atraumatic. ENT:      Ears:      Nose: No congestion/rhinnorhea.      Mouth/Throat: Mucous membranes are moist.  Neck: No stridor.  Cardiovascular: Normal rate, regular rhythm.  Good peripheral circulation. Respiratory: Normal respiratory effort without tachypnea or retractions. Lungs CTAB. Good air entry to the bases with no decreased or absent breath sounds. Gastrointestinal: Bowel sounds 4 quadrants. Soft and nontender to palpation. No guarding or rigidity. No palpable masses. No distention. Musculoskeletal:  Full range of motion to all extremities. No gross deformities appreciated. Neurologic:  Normal speech and language. No gross focal neurologic deficits are appreciated.  Skin:  Skin is warm, dry and intact. No rash noted. Psychiatric: Mood and affect are normal. Speech and behavior are normal. Patient exhibits appropriate insight and judgement.   ____________________________________________   LABS (all labs ordered are listed, but only abnormal results are displayed)  Labs Reviewed - No data to display ____________________________________________  EKG   ____________________________________________  RADIOLOGY   No results  found.  ____________________________________________    PROCEDURES  Procedure(s) performed:    Procedures    Medications - No data to display   ____________________________________________   INITIAL IMPRESSION / ASSESSMENT AND PLAN / ED COURSE  Pertinent labs & imaging results that were available during my care of the patient were reviewed by me and considered in my medical decision making (see chart for details).  Review of the [place] CSRS was performed in accordance of the [facility] prior to dispensing any controlled drugs.   Patient presents to emergency department after a spray of butyl acrylate to his left arm and abdomen.  Patient did not get any chemical to his face or inhale any chemical.  Patient's only current symptom is some residual stinging sensation to his left forearm. No visible rash. Poison control recommends a topical corticosteroid or antihistamine without further treatment.  Patient will be discharged home with prescriptions for hydrocortisone. Patient is to follow up with primary care as directed. Patient is given ED precautions to return to the ED for any worsening or new symptoms.   Hunter Clark was evaluated in Emergency Department on 12/02/2019 for the symptoms described in the history of present illness. He was evaluated in the context of the global COVID-19 pandemic, which necessitated consideration that the patient might be at risk for infection with the SARS-CoV-2 virus that causes COVID-19. Institutional protocols and algorithms that pertain to the evaluation of patients at risk for COVID-19 are in a state of rapid change based on information released by regulatory bodies including the CDC and federal and state organizations. These policies and algorithms were followed during the patient's care in the ED.  ____________________________________________  FINAL CLINICAL IMPRESSION(S) / ED DIAGNOSES  Final diagnoses:  Chemical exposure      NEW MEDICATIONS  STARTED DURING THIS VISIT:  ED Discharge Orders         Ordered    hydrocortisone cream 0.5 %  2 times daily     Discontinue  Reprint     12/02/19 1037              This chart was dictated using voice recognition software/Dragon. Despite best efforts to proofread, errors can occur which can change the meaning. Any change was purely unintentional.    Enid Derry, PA-C 12/02/19 1303    Shaune Pollack, MD 12/04/19 1547

## 2019-12-02 NOTE — ED Triage Notes (Signed)
Pt presents to ED via POV, states was exposed to Butyl Acrylate. Pt states splashed onto L arm and stomach. Pt states after exposure changed his clothes and washed skin with soap and water x 15 mins as recommended. No visible rash or open wounds noted to skin at this time.

## 2019-12-02 NOTE — Discharge Instructions (Signed)
Please take another shower when you get home.  You can apply the hydrocortisone cream to your forearm.  You can also take a dose of Benadryl when you get home.  Please return to the emergency department if you develop any additional or concerning symptoms.

## 2021-07-06 ENCOUNTER — Other Ambulatory Visit: Payer: Self-pay | Admitting: Family Medicine

## 2021-07-06 DIAGNOSIS — G8929 Other chronic pain: Secondary | ICD-10-CM

## 2021-07-06 DIAGNOSIS — G43109 Migraine with aura, not intractable, without status migrainosus: Secondary | ICD-10-CM

## 2021-08-20 ENCOUNTER — Other Ambulatory Visit: Payer: Self-pay | Admitting: Family Medicine

## 2021-08-20 DIAGNOSIS — G8929 Other chronic pain: Secondary | ICD-10-CM

## 2021-08-20 DIAGNOSIS — Z7689 Persons encountering health services in other specified circumstances: Secondary | ICD-10-CM

## 2021-08-20 DIAGNOSIS — G43109 Migraine with aura, not intractable, without status migrainosus: Secondary | ICD-10-CM

## 2022-01-01 ENCOUNTER — Emergency Department: Payer: BC Managed Care – PPO

## 2022-01-01 ENCOUNTER — Other Ambulatory Visit: Payer: Self-pay

## 2022-01-01 ENCOUNTER — Emergency Department
Admission: EM | Admit: 2022-01-01 | Discharge: 2022-01-01 | Disposition: A | Discharge status: Home / Self Care | Payer: Worker's Compensation | Attending: Emergency Medicine | Admitting: Emergency Medicine

## 2022-01-01 DIAGNOSIS — W228XXA Striking against or struck by other objects, initial encounter: Secondary | ICD-10-CM | POA: Diagnosis not present

## 2022-01-01 DIAGNOSIS — S0990XA Unspecified injury of head, initial encounter: Secondary | ICD-10-CM | POA: Insufficient documentation

## 2022-01-01 NOTE — ED Provider Notes (Signed)
   [facility] Provider Note    Event Date/Time   First MD Initiated Contact with Patient 01/01/22 [phone]     (approximate)  History   Chief Complaint: Head Injury  HPI  Hunter Clark is a 56 y.o. male with no significant past medical history who presents to the emergency department after head injury.  According to the patient he was hit in the head by a metal shipping container door.  Patient states that swung up and hitting him in the right side of the head.  Patient next remembers being picked up off the ground does not sure if he passed out.  Patient states this occurred around 4:30 AM.  He was told by his boss to come to the emergency department to get checked out.  Patient denies any weakness or numbness of any arm or leg denies any confusion but still has a moderate headache.  Physical Exam   Triage Vital Signs: ED Triage Vitals  Enc Vitals Group     BP 01/01/22 0851 (!) 140/96     Pulse Rate 01/01/22 0851 76     Resp 01/01/22 0851 18     Temp 01/01/22 0851 98.4 F (36.9 C)     Temp src --      SpO2 01/01/22 0851 97 %     Weight --      Height --      Head Circumference --      Peak Flow --      Pain Score 01/01/22 0850 5     Pain Loc --      Pain Edu? --      Excl. in GC? --     Most recent vital signs: Vitals:   01/01/22 0851  BP: (!) 140/96  Pulse: 76  Resp: 18  Temp: 98.4 F (36.9 C)  SpO2: 97%    General: Awake, no distress.  CV:  Good peripheral perfusion.  Regular rate and rhythm  Resp:  Normal effort.  Equal breath sounds bilaterally.  Abd:  No distention.  Soft, nontender.  No rebound or guarding. Other:  Equal grip strengths, no pronator drift.   ED Results / Procedures / Treatments   RADIOLOGY  I have reviewed and interpreted the CT head images, no concerning finding on my interpretation of the images. CT scan of the head and C-spine are negative for acute abnormality.   MEDICATIONS ORDERED IN ED: Medications -  No data to display   IMPRESSION / MDM / ASSESSMENT AND PLAN / ED COURSE  I reviewed the triage vital signs and the nursing notes.  Patient's presentation is most consistent with acute presentation with potential threat to life or bodily function.  Patient presents emergency department after head injury and possible LOC.  Given the head injury with possible LOC will obtain CT imaging of the head as a precaution.  Overall the patient appears well, reassuring physical exam reassuring neurological exam.  Vital signs are reassuring.  Patient states mild to moderate headache currently.  CT scan is negative.  We will discharge with supportive care Tylenol or ibuprofen as needed.  Patient agreeable to plan of care.  FINAL CLINICAL IMPRESSION(S) / ED DIAGNOSES   Head injury    Note:  This document was prepared using Dragon voice recognition software and may include unintentional dictation errors.   Minna Antis, MD 01/01/22 [phone]

## 2022-01-01 NOTE — ED Triage Notes (Addendum)
Pt comes with c/o right sided head pain from head injury. Pt states metal container fell and hit him in head while at work. Pt states he was just told to come here. Pt does not have any forms.  Pt filled out ineligibility form.

## 2022-01-03 ENCOUNTER — Other Ambulatory Visit: Payer: Self-pay

## 2022-01-03 ENCOUNTER — Emergency Department
Admission: EM | Admit: 2022-01-03 | Discharge: 2022-01-03 | Discharge status: Against Medical Advice | Payer: BC Managed Care – PPO | Attending: Emergency Medicine | Admitting: Emergency Medicine

## 2022-01-03 DIAGNOSIS — W228XXA Striking against or struck by other objects, initial encounter: Secondary | ICD-10-CM | POA: Insufficient documentation

## 2022-01-03 DIAGNOSIS — S0990XA Unspecified injury of head, initial encounter: Secondary | ICD-10-CM | POA: Insufficient documentation

## 2022-01-03 DIAGNOSIS — Z5321 Procedure and treatment not carried out due to patient leaving prior to being seen by health care provider: Secondary | ICD-10-CM | POA: Insufficient documentation

## 2022-01-03 NOTE — ED Notes (Signed)
Pt states he wanted to leave due to the wait time. Pt advised to wait and see provider, pt declined. EDP made aware.

## 2022-01-03 NOTE — ED Triage Notes (Signed)
Pt recently seen here for head injury. Pt has metal container fall on head. Pt states he has taken OTC meds with no relief. Pt states bad headaches now.

## 2023-03-05 ENCOUNTER — Emergency Department: Payer: Managed Care, Other (non HMO)

## 2023-03-05 ENCOUNTER — Other Ambulatory Visit: Payer: Self-pay

## 2023-03-05 ENCOUNTER — Emergency Department: Payer: Self-pay

## 2023-03-05 ENCOUNTER — Encounter: Payer: Self-pay | Admitting: Emergency Medicine

## 2023-03-05 ENCOUNTER — Inpatient Hospital Stay
Admission: EM | Admit: 2023-03-05 | Discharge: 2023-03-07 | DRG: 872 | Disposition: A | Discharge status: Home / Self Care | Payer: Managed Care, Other (non HMO) | Attending: Internal Medicine | Admitting: Internal Medicine

## 2023-03-05 DIAGNOSIS — Z8042 Family history of malignant neoplasm of prostate: Secondary | ICD-10-CM

## 2023-03-05 DIAGNOSIS — Z79899 Other long term (current) drug therapy: Secondary | ICD-10-CM | POA: Diagnosis not present

## 2023-03-05 DIAGNOSIS — D72829 Elevated white blood cell count, unspecified: Secondary | ICD-10-CM | POA: Diagnosis present

## 2023-03-05 DIAGNOSIS — B961 Klebsiella pneumoniae [K. pneumoniae] as the cause of diseases classified elsewhere: Secondary | ICD-10-CM | POA: Diagnosis present

## 2023-03-05 DIAGNOSIS — N12 Tubulo-interstitial nephritis, not specified as acute or chronic: Secondary | ICD-10-CM | POA: Diagnosis present

## 2023-03-05 DIAGNOSIS — R509 Fever, unspecified: Secondary | ICD-10-CM | POA: Diagnosis present

## 2023-03-05 DIAGNOSIS — K5900 Constipation, unspecified: Secondary | ICD-10-CM | POA: Diagnosis present

## 2023-03-05 DIAGNOSIS — N410 Acute prostatitis: Secondary | ICD-10-CM | POA: Diagnosis present

## 2023-03-05 DIAGNOSIS — M549 Dorsalgia, unspecified: Secondary | ICD-10-CM | POA: Diagnosis present

## 2023-03-05 DIAGNOSIS — A419 Sepsis, unspecified organism: Secondary | ICD-10-CM | POA: Diagnosis present

## 2023-03-05 DIAGNOSIS — F17210 Nicotine dependence, cigarettes, uncomplicated: Secondary | ICD-10-CM | POA: Diagnosis present

## 2023-03-05 DIAGNOSIS — Z720 Tobacco use: Secondary | ICD-10-CM | POA: Diagnosis present

## 2023-03-05 DIAGNOSIS — Z1152 Encounter for screening for COVID-19: Secondary | ICD-10-CM

## 2023-03-05 DIAGNOSIS — R519 Headache, unspecified: Secondary | ICD-10-CM | POA: Diagnosis present

## 2023-03-05 LAB — CBC WITH DIFFERENTIAL/PLATELET
Abs Immature Granulocytes: 0.07 10*3/uL (ref 0.00–0.07)
Basophils Absolute: 0.1 10*3/uL (ref 0.0–0.1)
Basophils Relative: 0 %
Eosinophils Absolute: 0.1 10*3/uL (ref 0.0–0.5)
Eosinophils Relative: 1 %
HCT: 42.5 % (ref 39.0–52.0)
Hemoglobin: 14.4 g/dL (ref 13.0–17.0)
Immature Granulocytes: 1 %
Lymphocytes Relative: 11 %
Lymphs Abs: 1.6 10*3/uL (ref 0.7–4.0)
MCH: 31.9 pg (ref 26.0–34.0)
MCHC: 33.9 g/dL (ref 30.0–36.0)
MCV: 94 fL (ref 80.0–100.0)
Monocytes Absolute: 1 10*3/uL (ref 0.1–1.0)
Monocytes Relative: 7 %
Neutro Abs: 11.7 10*3/uL — ABNORMAL HIGH (ref 1.7–7.7)
Neutrophils Relative %: 80 %
Platelets: 261 10*3/uL (ref 150–400)
RBC: 4.52 MIL/uL (ref 4.22–5.81)
RDW: 13.5 % (ref 11.5–15.5)
WBC: 14.5 10*3/uL — ABNORMAL HIGH (ref 4.0–10.5)
nRBC: 0 % (ref 0.0–0.2)

## 2023-03-05 LAB — COMPREHENSIVE METABOLIC PANEL
ALT: 24 U/L (ref 0–44)
AST: 28 U/L (ref 15–41)
Albumin: 3.9 g/dL (ref 3.5–5.0)
Alkaline Phosphatase: 50 U/L (ref 38–126)
Anion gap: 7 (ref 5–15)
BUN: 13 mg/dL (ref 6–20)
CO2: 26 mmol/L (ref 22–32)
Calcium: 8.3 mg/dL — ABNORMAL LOW (ref 8.9–10.3)
Chloride: 100 mmol/L (ref 98–111)
Creatinine, Ser: 1.25 mg/dL — ABNORMAL HIGH (ref 0.61–1.24)
GFR, Estimated: >60 mL/min (ref >60)
Glucose, Bld: 172 mg/dL — ABNORMAL HIGH (ref 70–99)
Potassium: 4 mmol/L (ref 3.5–5.1)
Sodium: 133 mmol/L — ABNORMAL LOW (ref 135–145)
Total Bilirubin: 1 mg/dL (ref 0.3–1.2)
Total Protein: 7.7 g/dL (ref 6.5–8.1)

## 2023-03-05 LAB — URINALYSIS, W/ REFLEX TO CULTURE (INFECTION SUSPECTED)
Bilirubin Urine: NEGATIVE
Glucose, UA: NEGATIVE mg/dL
Ketones, ur: NEGATIVE mg/dL
Nitrite: POSITIVE — AB
Protein, ur: NEGATIVE mg/dL
Specific Gravity, Urine: 1.024 (ref 1.005–1.030)
pH: 5 (ref 5.0–8.0)

## 2023-03-05 LAB — LACTIC ACID, PLASMA: Lactic Acid, Venous: 1.6 mmol/L (ref 0.5–1.9)

## 2023-03-05 MED ORDER — OXYCODONE HCL 5 MG PO TABS
5.0000 mg | ORAL_TABLET | Freq: Four times a day (QID) | ORAL | Status: DC | PRN
  Filled 2023-03-05: qty 1

## 2023-03-05 MED ORDER — MORPHINE SULFATE (PF) 2 MG/ML IV SOLN
2.0000 mg | INTRAVENOUS | Status: DC | PRN
  Administered 2023-03-05 (×2): 2 mg via INTRAVENOUS
  Filled 2023-03-05 (×3): qty 1

## 2023-03-05 MED ORDER — FENTANYL CITRATE PF 50 MCG/ML IJ SOSY: 25.0000 ug | PREFILLED_SYRINGE | INTRAMUSCULAR | Status: DC | PRN

## 2023-03-05 MED ORDER — ACETAMINOPHEN 325 MG PO TABS
650.0000 mg | ORAL_TABLET | Freq: Four times a day (QID) | ORAL | Status: DC | PRN
Start: 2023-03-05 — End: 2023-03-07
  Administered 2023-03-05 – 2023-03-07 (×5): 650 mg via ORAL
  Filled 2023-03-05 (×5): qty 2

## 2023-03-05 MED ORDER — ACETAMINOPHEN 650 MG RE SUPP: 650.0000 mg | Freq: Four times a day (QID) | RECTAL | Status: DC | PRN

## 2023-03-05 MED ORDER — ACETAMINOPHEN 500 MG PO TABS
1000.0000 mg | ORAL_TABLET | Freq: Once | ORAL | Status: AC
  Administered 2023-03-05: 1000 mg via ORAL
  Filled 2023-03-05: qty 2

## 2023-03-05 MED ORDER — MORPHINE SULFATE (PF) 2 MG/ML IV SOLN: 2.0000 mg | INTRAVENOUS | Status: DC | PRN

## 2023-03-05 MED ORDER — ENOXAPARIN SODIUM 60 MG/0.6ML IJ SOSY
45.0000 mg | PREFILLED_SYRINGE | INTRAMUSCULAR | Status: DC
  Administered 2023-03-05 – 2023-03-06 (×2): 45 mg via SUBCUTANEOUS
  Filled 2023-03-05 (×2): qty 0.6

## 2023-03-05 MED ORDER — SODIUM CHLORIDE 0.9 % IV SOLN
2.0000 g | INTRAVENOUS | Status: DC
  Administered 2023-03-05 – 2023-03-07 (×3): 2 g via INTRAVENOUS
  Filled 2023-03-05 (×4): qty 20

## 2023-03-05 MED ORDER — ONDANSETRON HCL 4 MG PO TABS: 4.0000 mg | ORAL_TABLET | Freq: Four times a day (QID) | ORAL | Status: DC | PRN

## 2023-03-05 MED ORDER — ACETAMINOPHEN 325 MG PO TABS
325.0000 mg | ORAL_TABLET | Freq: Once | ORAL | Status: AC
  Administered 2023-03-05: 325 mg via ORAL
  Filled 2023-03-05: qty 1

## 2023-03-05 MED ORDER — LIDOCAINE 5 % EX PTCH: 1.0000 | MEDICATED_PATCH | Freq: Every day | CUTANEOUS | Status: DC | PRN

## 2023-03-05 MED ORDER — ONDANSETRON HCL 4 MG/2ML IJ SOLN
4.0000 mg | Freq: Four times a day (QID) | INTRAMUSCULAR | Status: DC | PRN
  Administered 2023-03-05: 4 mg via INTRAVENOUS
  Filled 2023-03-05: qty 2

## 2023-03-05 MED ORDER — SODIUM CHLORIDE 0.9 % IV SOLN: INTRAVENOUS | Status: AC

## 2023-03-05 MED ORDER — MORPHINE SULFATE (PF) 4 MG/ML IV SOLN
4.0000 mg | Freq: Once | INTRAVENOUS | Status: AC
  Administered 2023-03-05: 4 mg via INTRAVENOUS
  Filled 2023-03-05: qty 1

## 2023-03-05 MED ORDER — SENNOSIDES-DOCUSATE SODIUM 8.6-50 MG PO TABS
1.0000 | ORAL_TABLET | Freq: Every evening | ORAL | Status: DC | PRN
  Administered 2023-03-07: 1 via ORAL
  Filled 2023-03-05: qty 1

## 2023-03-05 MED ORDER — LACTATED RINGERS IV BOLUS
1000.0000 mL | Freq: Once | INTRAVENOUS | Status: AC
  Administered 2023-03-05: 1000 mL via INTRAVENOUS

## 2023-03-05 MED ORDER — NICOTINE 21 MG/24HR TD PT24: 21.0000 mg | MEDICATED_PATCH | Freq: Every day | TRANSDERMAL | Status: DC | PRN

## 2023-03-05 NOTE — Assessment & Plan Note (Signed)
Follow up pending -  chlamydia/gonorrhea, hiv, rpr

## 2023-03-05 NOTE — Sepsis Progress Note (Signed)
Code sepsis protocol is being monitored by eLink

## 2023-03-05 NOTE — Sepsis Progress Note (Signed)
Secure chat to bedside to see if blood cultures were obtained yet. The blood cultures have not populated as obtained in [facility] chart yet.

## 2023-03-05 NOTE — Hospital Course (Signed)
Mr. Hunter Clark is a 57 year old male with no prior medical history, presents emergency department for chief concerns of urinary frequency, and back pain that started since Wednesday.  Vitals in the ED showed temperature 101.2, respiration rate 20, heart rate of 120, blood pressure 106/82, SpO2 90% on room air.  Serum sodium is 133, potassium 4.0, chloride 100, bicarb 26, BUN of 13, serum creatinine 1.25, EGFR greater than 60, nonfasting for glucose 172, WBC 14.5, hemoglobin 14.4, platelets of 261.  Lactic acid was 1.6.  UA was positive for small leukocytes and positive for nitrates.  ED treatment: Ceftriaxone 2 g IV, LR 1 L bolus, acetaminophen 1000 mg p.o. one-time dose, morphine 4 mg IV one-time dose.

## 2023-03-05 NOTE — Assessment & Plan Note (Signed)
Suspect secondary to acute prostatitis

## 2023-03-05 NOTE — Progress Notes (Signed)
PHARMACIST - PHYSICIAN COMMUNICATION  CONCERNING:  Enoxaparin (Lovenox) for DVT Prophylaxis    RECOMMENDATION: Patient was prescribed enoxaprin 40mg  q24 hours for VTE prophylaxis.   Filed Weights   03/05/23 1140  Weight: 95.3 kg (210 lb)    Body mass index is 33.89 kg/m.  Estimated Creatinine Clearance: 73 mL/min (A) (by C-G formula based on SCr of 1.25 mg/dL (H)).   Based on [facility] policy patient is candidate for enoxaparin 0.5mg /kg TBW SQ every 24 hours based on BMI being >30.   DESCRIPTION: Pharmacy has adjusted enoxaparin dose per [facility] policy.  Patient is now receiving enoxaparin 45 mg every 24 hours    Barrie Folk, PharmD Clinical Pharmacist  03/05/2023 3:19 PM

## 2023-03-05 NOTE — Assessment & Plan Note (Signed)
-   As needed nicotine patch ordered for nicotine craving

## 2023-03-05 NOTE — Assessment & Plan Note (Addendum)
Suspect secondary to acute prostatitis Symptomatic support: Lidocaine 1-2 patches daily as needed for back pain; acetaminophen 650 mg p.o./rectal every 6 hours.  For mild pain, fever, headache; oxycodone 5 mg p.o. every 6 hours as needed for moderate pain, 1 day ordered; morphine 2 mg IV every 4 hours as needed for severe pain, 20 hours ordered

## 2023-03-05 NOTE — ED Triage Notes (Signed)
Pt via POV from home. Pt c/o back pain and urinary frequency. Reports that the pain has been going on since Wednesday. Denies injury. Pt is A&OX4 and NAD

## 2023-03-05 NOTE — Assessment & Plan Note (Signed)
Tylenol and/or ibuprofen PRN fevers

## 2023-03-05 NOTE — ED Provider Notes (Signed)
[facility] Provider Note    Event Date/Time   First MD Initiated Contact with Patient 03/05/23 1226     (approximate)   History   Chief Complaint Back Pain   HPI  Hunter Clark is a 57 y.o. male with no significant past medical history who presents to the ED complaining of flank pain.  Patient reports that he has been dealing with increasing sharp pain in his right flank over the past 24 hours.  This been associated with burning and discomfort when he goes to urinate, which she describes as "like a plunger" when he goes to pee.  He denies any fevers and has not had any nausea, vomiting, or diarrhea.  He also denies any cough, chest pain, shortness of breath.  He denies any history of urinary tract infections or kidney stones.     Physical Exam   Triage Vital Signs: ED Triage Vitals  Encounter Vitals Group     BP 03/05/23 1143 106/82     Systolic BP Percentile --      Diastolic BP Percentile --      Pulse Rate 03/05/23 1143 (!) 120     Resp 03/05/23 1143 20     Temp 03/05/23 1143 (!) 101.2 F (38.4 C)     Temp Source 03/05/23 1143 Oral     SpO2 03/05/23 1143 98 %     Weight 03/05/23 1140 210 lb (95.3 kg)     Height 03/05/23 1140 5\' 6"  (1.676 m)     Head Circumference --      Peak Flow --      Pain Score --      Pain Loc --      Pain Education --      Exclude from Growth Chart --     Most recent vital signs: Vitals:   03/05/23 1339 03/05/23 1400  BP:  118/74  Pulse:  99  Resp: (!) 23 20  Temp:    SpO2:  95%    Constitutional: Alert and oriented. Eyes: Conjunctivae are normal. Head: Atraumatic. Nose: No congestion/rhinnorhea. Mouth/Throat: Mucous membranes are moist.  Cardiovascular: Tachycardic, regular rhythm. Grossly normal heart sounds.  2+ radial pulses bilaterally. Respiratory: Normal respiratory effort.  No retractions. Lungs CTAB. Gastrointestinal: Soft and nontender.  Right CVA tenderness noted.  No  distention. Musculoskeletal: No lower extremity tenderness nor edema.  Neurologic:  Normal speech and language. No gross focal neurologic deficits are appreciated.    ED Results / Procedures / Treatments   Labs (all labs ordered are listed, but only abnormal results are displayed) Labs Reviewed  COMPREHENSIVE METABOLIC PANEL - Abnormal; Notable for the following components:      Result Value   Sodium 133 (*)    Glucose, Bld 172 (*)    Creatinine, Ser 1.25 (*)    Calcium 8.3 (*)    All other components within normal limits  CBC WITH DIFFERENTIAL/PLATELET - Abnormal; Notable for the following components:   WBC 14.5 (*)    Neutro Abs 11.7 (*)    All other components within normal limits  URINALYSIS, W/ REFLEX TO CULTURE (INFECTION SUSPECTED) - Abnormal; Notable for the following components:   Color, Urine YELLOW (*)    APPearance HAZY (*)    Hgb urine dipstick SMALL (*)    Nitrite POSITIVE (*)    Leukocytes,Ua SMALL (*)    Bacteria, UA RARE (*)    All other components within normal limits  URINE CULTURE  CULTURE,  BLOOD (ROUTINE X 2)  CULTURE, BLOOD (ROUTINE X 2)  LACTIC ACID, PLASMA   RADIOLOGY CT renal protocol reviewed and interpreted by me with no kidney stone or hydronephrosis.  PROCEDURES:  Critical Care performed: No  Procedures   MEDICATIONS ORDERED IN ED: Medications  cefTRIAXone (ROCEPHIN) 2 g in sodium chloride 0.9 % 100 mL IVPB (0 g Intravenous Stopped 03/05/23 1338)  lactated ringers bolus 1,000 mL (1,000 mLs Intravenous New Bag/Given 03/05/23 1254)  acetaminophen (TYLENOL) tablet 1,000 mg (1,000 mg Oral Given 03/05/23 1309)  morphine (PF) 4 MG/ML injection 4 mg (4 mg Intravenous Given 03/05/23 1255)     IMPRESSION / MDM / ASSESSMENT AND PLAN / ED COURSE  I reviewed the triage vital signs and the nursing notes.                              57 y.o. male with no significant past medical history who presents to the ED complaining of increasing pain  around his right flank for the past 24 hours with dysuria.  Patient's presentation is most consistent with acute presentation with potential threat to life or bodily function.  Differential diagnosis includes, but is not limited to, pulm nephritis, cystitis, sepsis, kidney stone, cholecystitis, biliary colic, electrolyte abnormality, AKI.  Patient uncomfortable but nontoxic-appearing and in no acute distress, vital signs remarkable for fever and tachycardia, otherwise reassuring.  Patient with right CVA tenderness on exam, no abdominal tenderness noted.  He does meet sepsis criteria and urinalysis concerning for infection, urine was sent for culture.  We will initiate sepsis workup, hydrate with IV fluids, and treat with IV antibiotics.  We will also check CT renal protocol to ensure no contributing kidney stone or other obstructive process.  We will treat pain with IV morphine and reassess following imaging.  Labs show leukocytosis with no significant anemia, electrolyte abnormality, or AKI.  Lactic acid within normal limits.  CT renal protocol unremarkable by my read, radiology read is pending at this time.  Patient reports feeling better following symptomatic treatment, case discussed with hospitalist for admission.      FINAL CLINICAL IMPRESSION(S) / ED DIAGNOSES   Final diagnoses:  Sepsis without acute organ dysfunction, due to unspecified organism [facility])  Pyelonephritis     Rx / DC Orders   ED Discharge Orders     None        Note:  This document was prepared using Dragon voice recognition software and may include unintentional dictation errors.   Chesley Noon, MD 03/05/23 [phone]

## 2023-03-05 NOTE — H&P (Signed)
History and Physical   Hunter Clark WUX:[id number] DOB: 1965-10-08 DOA: 03/05/2023  PCP: [facility]  Patient coming from: home via pov  I have personally briefly reviewed patient's old medical records in [facility] EMR.  Chief Concern: back pain, fever, difficulty urinat  HPI: Hunter Clark is a 57 year old male with no prior medical history, presents emergency department for chief concerns of urinary frequency, and back pain that started since Wednesday.  Vitals in the ED showed temperature 101.2, respiration rate 20, heart rate of 120, blood pressure 106/82, SpO2 90% on room air.  Serum sodium is 133, potassium 4.0, chloride 100, bicarb 26, BUN of 13, serum creatinine 1.25, EGFR greater than 60, nonfasting for glucose 172, WBC 14.5, hemoglobin 14.4, platelets of 261.  Lactic acid was 1.6.  UA was positive for small leukocytes and positive for nitrates.  ED treatment: Ceftriaxone 2 g IV, LR 1 L bolus, acetaminophen 1000 mg p.o. one-time dose, morphine 4 mg IV one-time dose. ----------------------------- At bedside, he was able to tell me his name, age, current location, current calendar year.   He report the back pain and stop/start urination stated yesterday. He endorses dysuria and denies hematuria. He reprots his urine has been darker and denies blood. He denies diarrhea. He endorses constipation.   He denies nausea, vomiting.   He reports he has not had a constipation.  Social history: He lives with his girlfriend. He smokes 5 cigarettes per day. He infrequently drinks etoh, last drink was two months ago. He denies IV drug use. He works in the a Location manager.   ROS: Constitutional: no weight change, no fever ENT/Mouth: no sore throat, no rhinorrhea Eyes: no eye pain, no vision changes Cardiovascular: no chest pain, no dyspnea,  no edema, no palpitations Respiratory: no cough, no sputum, no wheezing Gastrointestinal: no nausea, no vomiting,  no diarrhea, + constipation Genitourinary: no urinary incontinence, + dysuria, no hematuria Musculoskeletal: no arthralgias, no myalgias, + back pain Skin: no skin lesions, no pruritus, Neuro: + weakness, no loss of consciousness, no syncope Psych: no anxiety, no depression, no decrease appetite Heme/Lymph: no bruising, no bleeding  ED Course: Discussed with edp, patient requiring hospitalization for chief concern of   Assessment/Plan  Principal Problem:   Pyelonephritis Active Problems:   Leukocytosis   Fever   Back pain   Prostatitis, acute   Tobacco use   Assessment and Plan:  * Pyelonephritis Acute prostatitis Urine culture is in process Blood cultures x 2 have been ordered Continue with ceftriaxone 2 g IV daily, 7 days ordered Admit to telemetry medical, inpatient  Tobacco use As needed nicotine patch ordered for nicotine craving  Prostatitis, acute Check chlamydia/gonorrhea, hiv, rpr  Back pain Suspect secondary to acute prostatitis Symptomatic support: Lidocaine 1-2 patches daily as needed for back pain; acetaminophen 650 mg p.o./rectal every 6 hours.  For mild pain, fever, headache; oxycodone 5 mg p.o. every 6 hours as needed for moderate pain, 1 day ordered; morphine 2 mg IV every 4 hours as needed for severe pain, 20 hours ordered  Fever Improved with acetaminophen  Leukocytosis Suspect secondary to acute prostatitis  Health maintenance - counseled patient on healthier living, decreasing stress, healthy exercise, getting annual physical exam, and colonoscopies. Patient endorses understanding and compliance.  Chart reviewed.   DVT prophylaxis: Enoxaparin Code Status: full code Diet: Regular diet Family Communication: a phone call was offered, patient declined, stating girlfriend is updated Disposition Plan: Pending clinical course Consults called: None  at this time Admission status: Telemetry medical, inpatient  History reviewed. No pertinent past  medical history. Past Surgical History:  Procedure Laterality Date   HERNIA REPAIR     Social History:  reports that he has been smoking cigarettes. He has never used smokeless tobacco. He reports current alcohol use. He reports current drug use. Drug: Marijuana.  No Known Allergies Family History  Problem Relation Age of Onset   Prostate cancer Maternal Grandfather    Family history: Family history reviewed and not pertinent.  Prior to Admission medications   Medication Sig Start Date End Date Taking? Authorizing Provider  gabapentin (NEURONTIN) 300 MG capsule Take 300 mg by mouth 3 (three) times daily.    [provider]  hydrocortisone cream 0.5 % Apply 1 application topically 2 (two) times daily. 12/02/19   Enid Derry, PA-C   Physical Exam: Vitals:   03/05/23 1544 03/05/23 1600 03/05/23 1640 03/05/23 1641  BP:  107/75 123/85   Pulse: 90 91 93 95  Resp: 19 16 18 17   Temp:    98.2 F (36.8 C)  TempSrc:    Oral  SpO2: 100% 100% 100% 100%  Weight:      Height:       Constitutional: appears age appropriate, NAD, calm Eyes: PERRL, lids and conjunctivae normal ENMT: Mucous membranes are moist. Posterior pharynx clear of any exudate or lesions. Age-appropriate dentition. Hearing appropriate Neck: normal, supple, no masses, no thyromegaly Respiratory: clear to auscultation bilaterally, no wheezing, no crackles. Normal respiratory effort. No accessory muscle use.  Cardiovascular: Regular rate and rhythm, no murmurs / rubs / gallops. No extremity edema. 2+ pedal pulses. No carotid bruits.  Abdomen: no tenderness, no masses palpated, no hepatosplenomegaly. Bowel sounds positive.  Musculoskeletal: no clubbing / cyanosis. No joint deformity upper and lower extremities. Good ROM, no contractures, no atrophy. Normal muscle tone.  Skin: no rashes, lesions, ulcers. No induration Neurologic: Sensation intact. Strength 5/5 in all 4.  Psychiatric: Normal judgment and insight.  Alert and oriented x 3. Normal mood.   EKG: Not indicated at this time  Chest x-ray on Admission: not indicated at this time  CT Renal Stone Study  Result Date: 03/05/2023 CLINICAL DATA:  Back pain and urinary frequency. EXAM: CT ABDOMEN AND PELVIS WITHOUT CONTRAST TECHNIQUE: Multidetector CT imaging of the abdomen and pelvis was performed following the standard protocol without IV contrast. RADIATION DOSE REDUCTION: This exam was performed according to the departmental dose-optimization program which includes automated exposure control, adjustment of the mA and/or kV according to patient size and/or use of iterative reconstruction technique. COMPARISON:  None Available. FINDINGS: Lower chest: The lung bases are clear. The imaged heart is unremarkable. Hepatobiliary: The liver and gallbladder are unremarkable. There is no biliary ductal dilatation. Pancreas: Unremarkable. Spleen: Unremarkable. Adrenals/Urinary Tract: The adrenals are unremarkable. The kidneys are unremarkable, with no focal lesion, stone, hydronephrosis, or hydroureter. The bladder is unremarkable. Stomach/Bowel: The stomach is unremarkable. There is no evidence of bowel obstruction. There is no abnormal bowel wall thickening or inflammatory change. The appendix is normal. Vascular/Lymphatic: The abdominal aorta is normal in course and caliber. There is no abdominal or pelvic lymphadenopathy. Reproductive: There is mild fat stranding around the prostate and seminal vesicles. Other: There is no ascites or free air. Musculoskeletal: There is no acute osseous abnormality or suspicious osseous lesion. IMPRESSION: 1. Mild stranding around the prostate and seminal vesicles suggestive of acute prostatitis. 2. No other acute finding in the abdomen or pelvis. Electronically Signed  By: Lesia Hausen M.D.   On: 03/05/2023 15:56    Labs on Admission: I have personally reviewed following labs  CBC: Recent Labs  Lab 03/05/23 1148  WBC 14.5*   NEUTROABS 11.7*  HGB 14.4  HCT 42.5  MCV 94.0  PLT 261   Basic Metabolic Panel: Recent Labs  Lab 03/05/23 1148  NA 133*  K 4.0  CL 100  CO2 26  GLUCOSE 172*  BUN 13  CREATININE 1.25*  CALCIUM 8.3*   GFR: Estimated Creatinine Clearance: 73 mL/min (A) (by C-G formula based on SCr of 1.25 mg/dL (H)).  Liver Function Tests: Recent Labs  Lab 03/05/23 1148  AST 28  ALT 24  ALKPHOS 50  BILITOT 1.0  PROT 7.7  ALBUMIN 3.9   Urine analysis:    Component Value Date/Time   COLORURINE YELLOW (A) 03/05/2023 1148   APPEARANCEUR HAZY (A) 03/05/2023 1148   LABSPEC 1.024 03/05/2023 1148   PHURINE 5.0 03/05/2023 1148   GLUCOSEU NEGATIVE 03/05/2023 1148   HGBUR SMALL (A) 03/05/2023 1148   BILIRUBINUR NEGATIVE 03/05/2023 1148   KETONESUR NEGATIVE 03/05/2023 1148   PROTEINUR NEGATIVE 03/05/2023 1148   NITRITE POSITIVE (A) 03/05/2023 1148   LEUKOCYTESUR SMALL (A) 03/05/2023 1148   This document was prepared using Dragon Voice Recognition software and may include unintentional dictation errors.  Dr. Sedalia Muta [facility]  If 7PM-7AM, please contact overnight-coverage provider If 7AM-7PM, please contact day attending provider [email or web address]  03/05/2023, 6:32 PM

## 2023-03-05 NOTE — Plan of Care (Signed)

## 2023-03-05 NOTE — Assessment & Plan Note (Addendum)
Acute prostatitis Urine culture +Klebsiellla Initially treated with IV Rocephin Transition to PO Augmentin to complete 10 day course (longer duration given UTI complicated by proctitis) Repeat imaging if ongoing fevers or escalating flank pain Pyridium for dysuria

## 2023-03-05 NOTE — Consult Note (Signed)
CODE SEPSIS - PHARMACY COMMUNICATION  **Broad Spectrum Antibiotics should be administered within 1 hour of Sepsis diagnosis**  Time Code Sepsis Called/Page Received: 1236  Antibiotics Ordered: ceftriaxone  Time of 1st antibiotic administration: 1252  Additional action taken by pharmacy: n/a  If necessary, Name of Provider/Nurse Contacted: n/a    Bettey Costa ,PharmD Clinical Pharmacist  03/05/2023  12:38 PM

## 2023-03-06 DIAGNOSIS — N12 Tubulo-interstitial nephritis, not specified as acute or chronic: Secondary | ICD-10-CM | POA: Diagnosis not present

## 2023-03-06 DIAGNOSIS — A419 Sepsis, unspecified organism: Secondary | ICD-10-CM | POA: Diagnosis present

## 2023-03-06 LAB — CBC
HCT: 41.4 % (ref 39.0–52.0)
Hemoglobin: 13.9 g/dL (ref 13.0–17.0)
MCH: 31.4 pg (ref 26.0–34.0)
MCHC: 33.6 g/dL (ref 30.0–36.0)
MCV: 93.5 fL (ref 80.0–100.0)
Platelets: 225 10*3/uL (ref 150–400)
RBC: 4.43 MIL/uL (ref 4.22–5.81)
RDW: 13.6 % (ref 11.5–15.5)
WBC: 11.2 10*3/uL — ABNORMAL HIGH (ref 4.0–10.5)
nRBC: 0 % (ref 0.0–0.2)

## 2023-03-06 LAB — HIV ANTIBODY (ROUTINE TESTING W REFLEX): HIV Screen 4th Generation wRfx: NONREACTIVE

## 2023-03-06 LAB — BASIC METABOLIC PANEL
Anion gap: 10 (ref 5–15)
BUN: 13 mg/dL (ref 6–20)
CO2: 25 mmol/L (ref 22–32)
Calcium: 8.3 mg/dL — ABNORMAL LOW (ref 8.9–10.3)
Chloride: 99 mmol/L (ref 98–111)
Creatinine, Ser: 1.13 mg/dL (ref 0.61–1.24)
GFR, Estimated: >60 mL/min (ref >60)
Glucose, Bld: 146 mg/dL — ABNORMAL HIGH (ref 70–99)
Potassium: 4.5 mmol/L (ref 3.5–5.1)
Sodium: 134 mmol/L — ABNORMAL LOW (ref 135–145)

## 2023-03-06 LAB — RPR: RPR Ser Ql: NONREACTIVE

## 2023-03-06 LAB — SARS CORONAVIRUS 2 BY RT PCR: SARS Coronavirus 2 by RT PCR: NEGATIVE

## 2023-03-06 MED ORDER — MORPHINE SULFATE (PF) 2 MG/ML IV SOLN: 2.0000 mg | INTRAVENOUS | Status: DC | PRN

## 2023-03-06 MED ORDER — PHENAZOPYRIDINE HCL 200 MG PO TABS
200.0000 mg | ORAL_TABLET | Freq: Three times a day (TID) | ORAL | Status: DC
  Administered 2023-03-06 – 2023-03-07 (×5): 200 mg via ORAL
  Filled 2023-03-06 (×5): qty 1

## 2023-03-06 MED ORDER — OXYCODONE HCL 5 MG PO TABS
5.0000 mg | ORAL_TABLET | ORAL | Status: DC | PRN
  Administered 2023-03-06 – 2023-03-07 (×2): 10 mg via ORAL
  Administered 2023-03-07 (×2): 5 mg via ORAL
  Filled 2023-03-06: qty 1
  Filled 2023-03-06: qty 2
  Filled 2023-03-06: qty 1
  Filled 2023-03-06: qty 2

## 2023-03-06 MED ORDER — IBUPROFEN 400 MG PO TABS
400.0000 mg | ORAL_TABLET | Freq: Four times a day (QID) | ORAL | Status: DC | PRN
  Administered 2023-03-06: 400 mg via ORAL
  Filled 2023-03-06: qty 1

## 2023-03-06 MED ORDER — DIPHENHYDRAMINE HCL 50 MG/ML IJ SOLN
12.5000 mg | Freq: Four times a day (QID) | INTRAMUSCULAR | Status: DC | PRN
  Administered 2023-03-06 – 2023-03-07 (×2): 12.5 mg via INTRAVENOUS
  Filled 2023-03-06 (×2): qty 1

## 2023-03-06 NOTE — Plan of Care (Signed)

## 2023-03-06 NOTE — Assessment & Plan Note (Signed)
Sepsis POA with fever and leukocytosis in setting of UTI/prostatitis.  Lactic acid normal. --Continue antibiotics as outlined --Follow cultures --Monitor fever curve, CBC --Tylenol or ibuprofen PRN fevers

## 2023-03-06 NOTE — Progress Notes (Signed)
  Progress Note   Patient: Hunter Clark WUJ:[id number] DOB: Dec 09, 1965 DOA: 03/05/2023     1 DOS: the patient was seen and examined on 03/06/2023   Brief hospital course: HPI on admission 03/05/23: "Mr. Hunter Clark is a 57 year old male with no prior medical history, presents emergency department for chief concerns of urinary frequency, and back pain that started since Wednesday. Vitals in the ED showed temperature 101.2, respiration rate 20, heart rate of 120, blood pressure 106/82, SpO2 90% on room air. WBC 14.5, Lactic acid was 1.6. UA was positive for small leukocytes and positive for nitrates."  CT renal stone protocol showed changes of acute prostatitis without other acute findings.  Patient was started on empiric IV Rocephin and admitted for further evaluation and management, pending cultures.   Assessment and Plan:  * Pyelonephritis Acute prostatitis Follow pending urine & blood cultures Continue Rocephin Repeat imaging if ongoing fevers or escalating pain Pyridium for dysuria  Sepsis (HCC) Sepsis POA with fever and leukocytosis in setting of UTI/prostatitis.  Lactic acid normal. --Continue antibiotics as outlined --Follow cultures --Monitor fever curve, CBC --Tylenol or ibuprofen PRN fevers  Tobacco use As needed nicotine patch   Prostatitis, acute Follow up pending -  chlamydia/gonorrhea, hiv, rpr  Back pain Suspect secondary to acute prostatitis Pain control PRN - per orders   Fever Tylenol and/or ibuprofen PRN fevers  Leukocytosis See Sepsis / Prostatitis        Subjective: Pt seen with male family member at bedside today.  He reports ongoing fever/chills, could not warm up despite several blankets earlier.  No N/V/D.  Reports a headache, intermittent cough.  No other acute complaints.    Physical Exam: Vitals:   03/06/23 1004 03/06/23 1131 03/06/23 1200 03/06/23 1403  BP:  129/78    Pulse:  (!) 116    Resp:  18    Temp: (!) 101.4 F  (38.6 C) (!) 101.8 F (38.8 C) 98.9 F (37.2 C) 99.6 F (37.6 C)  TempSrc: Oral  Oral Oral  SpO2:  92%    Weight:      Height:       General exam: awake, alert, no acute distress, mildly ill appearing HEENT: moist mucus membranes, hearing grossly normal  Respiratory system: CTAB, no wheezes, rales or rhonchi, normal respiratory effort. Cardiovascular system: normal S1/S2, RRR, no JVD, murmurs, rubs, gallops, no pedal edema.   Gastrointestinal system: soft, NT, ND, no HSM felt, +bowel sounds. Central nervous system: A&O x 3. no gross focal neurologic deficits, normal speech Extremities: moves all, no edema, normal tone Skin: dry, intact, warm, no rashes seen Psychiatry: normal mood, congruent affect, judgement and insight appear normal   Data Reviewed:  Notable labs   WBC 14.5 >> 11.2, Na 134, Glucose 146, Ca 8.3  Urine culture - growing Klebsiella pneumoniae > 100k colonies, susceptibilities pending  Family Communication: At bedside on rounds   Disposition: Status is: Inpatient Remains inpatient appropriate because: remains on IV antibiotics pending cultures   Planned Discharge Destination: Home    Time spent: 40 minutes  Author: Pennie Banter, DO 03/06/2023 2:18 PM  For on call review [email or web address].

## 2023-03-06 NOTE — Progress Notes (Signed)
56yOm admitted for pylenephritis. Pt temp this morning 0620 103F HR elevated 126. BP 131/69. Gave tylenol and removed excess blankets. Pt has been voiding well. Pt continues on IVF and IVABT Rocephin. Blood culture pending. MD notified. No new orders at this time. Will continue to monitor and inform on coming morning nurse.

## 2023-03-07 DIAGNOSIS — N12 Tubulo-interstitial nephritis, not specified as acute or chronic: Secondary | ICD-10-CM | POA: Diagnosis not present

## 2023-03-07 DIAGNOSIS — R519 Headache, unspecified: Secondary | ICD-10-CM | POA: Diagnosis present

## 2023-03-07 LAB — CBC
HCT: 41.1 % (ref 39.0–52.0)
Hemoglobin: 13.7 g/dL (ref 13.0–17.0)
MCH: 31.9 pg (ref 26.0–34.0)
MCHC: 33.3 g/dL (ref 30.0–36.0)
MCV: 95.6 fL (ref 80.0–100.0)
Platelets: 209 10*3/uL (ref 150–400)
RBC: 4.3 MIL/uL (ref 4.22–5.81)
RDW: 13.5 % (ref 11.5–15.5)
WBC: 8.1 10*3/uL (ref 4.0–10.5)
nRBC: 0 % (ref 0.0–0.2)

## 2023-03-07 LAB — BASIC METABOLIC PANEL
Anion gap: 8 (ref 5–15)
BUN: 11 mg/dL (ref 6–20)
CO2: 27 mmol/L (ref 22–32)
Calcium: 8 mg/dL — ABNORMAL LOW (ref 8.9–10.3)
Chloride: 100 mmol/L (ref 98–111)
Creatinine, Ser: 1.02 mg/dL (ref 0.61–1.24)
GFR, Estimated: >60 mL/min (ref >60)
Glucose, Bld: 109 mg/dL — ABNORMAL HIGH (ref 70–99)
Potassium: 3.7 mmol/L (ref 3.5–5.1)
Sodium: 135 mmol/L (ref 135–145)

## 2023-03-07 LAB — URINE CULTURE: Culture: >=100000 — AB

## 2023-03-07 MED ORDER — AMOXICILLIN-POT CLAVULANATE 875-125 MG PO TABS: 1.0000 | ORAL_TABLET | Freq: Two times a day (BID) | ORAL | 0 refills | Status: AC

## 2023-03-07 MED ORDER — ACETAMINOPHEN 325 MG PO TABS: 650.0000 mg | ORAL_TABLET | Freq: Four times a day (QID) | ORAL | Status: AC | PRN

## 2023-03-07 MED ORDER — PHENAZOPYRIDINE HCL 200 MG PO TABS: 200.0000 mg | ORAL_TABLET | Freq: Three times a day (TID) | ORAL | 0 refills | Status: AC

## 2023-03-07 MED ORDER — BUTALBITAL-APAP-CAFFEINE 50-325-40 MG PO TABS: 1.0000 | ORAL_TABLET | Freq: Four times a day (QID) | ORAL | Status: DC | PRN

## 2023-03-07 MED ORDER — OXYCODONE HCL 5 MG PO TABS: 5.0000 mg | ORAL_TABLET | ORAL | 0 refills | Status: AC | PRN

## 2023-03-07 MED ORDER — SENNOSIDES-DOCUSATE SODIUM 8.6-50 MG PO TABS: 1.0000 | ORAL_TABLET | Freq: Every evening | ORAL | Status: AC | PRN

## 2023-03-07 MED ORDER — IBUPROFEN 400 MG PO TABS
400.0000 mg | ORAL_TABLET | Freq: Four times a day (QID) | ORAL | Status: DC | PRN
  Administered 2023-03-07: 400 mg via ORAL
  Filled 2023-03-07: qty 1

## 2023-03-07 MED ORDER — ORAL CARE MOUTH RINSE: 15.0000 mL | OROMUCOSAL | Status: DC | PRN

## 2023-03-07 MED ORDER — AMOXICILLIN-POT CLAVULANATE 875-125 MG PO TABS: 1.0000 | ORAL_TABLET | Freq: Two times a day (BID) | ORAL | Status: DC

## 2023-03-07 MED ORDER — IBUPROFEN 400 MG PO TABS: 400.0000 mg | ORAL_TABLET | Freq: Four times a day (QID) | ORAL | Status: AC | PRN

## 2023-03-07 NOTE — Assessment & Plan Note (Signed)
Tylenol or ibuprofen PRN Fioricet PRN if not relieved by above (Oxycodone for flank pain might be contributing, as opioids can cause HA) Negative for Covid-19 on 10/31

## 2023-03-07 NOTE — Discharge Summary (Signed)
Physician Discharge Summary   Patient: Hunter Clark MRN: [id number] DOB: March 04, 1966  Admit date:     03/05/2023  Discharge date: {dischdate:26783}  Discharge Physician: Pennie Banter   PCP: [facility]   Recommendations at discharge:  {Tip this will not be part of the note when signed- Example include specific recommendations for outpatient follow-up, pending tests to follow-up on. (Optional):26781}  ***  Discharge Diagnoses: Principal Problem:   Pyelonephritis Active Problems:   Leukocytosis   Fever   Back pain   Prostatitis, acute   Tobacco use   Sepsis (HCC)   Headache  Resolved Problems:   * No resolved hospital problems. [facility] Course: HPI on admission 03/05/23: "Hunter Clark is a 57 year old male with no prior medical history, presents emergency department for chief concerns of urinary frequency, and back pain that started since Wednesday. Vitals in the ED showed temperature 101.2, respiration rate 20, heart rate of 120, blood pressure 106/82, SpO2 90% on room air. WBC 14.5, Lactic acid was 1.6. UA was positive for small leukocytes and positive for nitrates."  CT renal stone protocol showed changes of acute prostatitis without other acute findings.  Patient was started on empiric IV Rocephin and admitted for further evaluation and management, pending cultures.  Assessment and Plan: * Pyelonephritis Acute prostatitis Urine culture +Klebsiellla Initially treated with IV Rocephin Transition to PO Augmentin to complete 10 day course (longer duration given UTI complicated by proctitis) Repeat imaging if ongoing fevers or escalating flank pain Pyridium for dysuria  Headache Tylenol or ibuprofen PRN Fioricet PRN if not relieved by above (Oxycodone for flank pain might be contributing, as opioids can cause HA) Negative for Covid-19 on 10/31  Sepsis (HCC) Sepsis POA with fever and leukocytosis in setting of UTI/prostatitis.  Lactic acid  normal. --Continue antibiotics as outlined --Follow cultures --Monitor fever curve, CBC --Tylenol or ibuprofen PRN fevers  Tobacco use As needed nicotine patch   Prostatitis, acute Follow up pending -  chlamydia/gonorrhea, hiv, rpr  Back pain Suspect secondary to acute prostatitis Pain control PRN - per orders   Fever Tylenol and/or ibuprofen PRN fevers  Leukocytosis See Sepsis / Prostatitis      {Tip this will not be part of the note when signed Body mass index is 33.89 kg/m. , ,  (Optional):26781}  {(NOTE) Pain control PDMP Statment (Optional):26782} Consultants: *** Procedures performed: ***  Disposition: {Plan; Disposition:26390} Diet recommendation:  Discharge Diet Orders (From admission, onward)     Start     Ordered   03/07/23 0000  Diet - low sodium heart healthy        03/07/23 1712           {Diet_Plan:26776} DISCHARGE MEDICATION: Allergies as of 03/07/2023   No Known Allergies      Medication List     TAKE these medications    acetaminophen 325 MG tablet Commonly known as: TYLENOL Take 2 tablets (650 mg total) by mouth every 6 (six) hours as needed for mild pain (pain score 1-3), fever or headache (or Fever >/= 101).   amoxicillin-clavulanate 875-125 MG tablet Commonly known as: AUGMENTIN Take 1 tablet by mouth every 12 (twelve) hours for 7 days. Start taking on: March 08, 2023   gabapentin 300 MG capsule Commonly known as: NEURONTIN Take 300 mg by mouth 3 (three) times daily.   hydrocortisone cream 0.5 % Apply 1 application topically 2 (two) times daily.   ibuprofen 400 MG tablet Commonly known as: ADVIL Take  1 tablet (400 mg total) by mouth every 6 (six) hours as needed (fever despite TYlenol or headaches).   oxyCODONE 5 MG immediate release tablet Commonly known as: Oxy IR/ROXICODONE Take 1-2 tablets (5-10 mg total) by mouth every 4 (four) hours as needed for moderate pain (pain score 4-6) or severe pain (pain score  7-10).   phenazopyridine 200 MG tablet Commonly known as: PYRIDIUM Take 1 tablet (200 mg total) by mouth 3 (three) times daily with meals for 3 days.   senna-docusate 8.6-50 MG tablet Commonly known as: Senokot-S Take 1 tablet by mouth at bedtime as needed for mild constipation.        Discharge Exam: Filed Weights   03/05/23 1140  Weight: 95.3 kg   ***  Condition at discharge: {DC Condition:26389}  The results of significant diagnostics from this hospitalization (including imaging, microbiology, ancillary and laboratory) are listed below for reference.   Imaging Studies: CT Renal Stone Study  Result Date: 03/05/2023 CLINICAL DATA:  Back pain and urinary frequency. EXAM: CT ABDOMEN AND PELVIS WITHOUT CONTRAST TECHNIQUE: Multidetector CT imaging of the abdomen and pelvis was performed following the standard protocol without IV contrast. RADIATION DOSE REDUCTION: This exam was performed according to the departmental dose-optimization program which includes automated exposure control, adjustment of the mA and/or kV according to patient size and/or use of iterative reconstruction technique. COMPARISON:  None Available. FINDINGS: Lower chest: The lung bases are clear. The imaged heart is unremarkable. Hepatobiliary: The liver and gallbladder are unremarkable. There is no biliary ductal dilatation. Pancreas: Unremarkable. Spleen: Unremarkable. Adrenals/Urinary Tract: The adrenals are unremarkable. The kidneys are unremarkable, with no focal lesion, stone, hydronephrosis, or hydroureter. The bladder is unremarkable. Stomach/Bowel: The stomach is unremarkable. There is no evidence of bowel obstruction. There is no abnormal bowel wall thickening or inflammatory change. The appendix is normal. Vascular/Lymphatic: The abdominal aorta is normal in course and caliber. There is no abdominal or pelvic lymphadenopathy. Reproductive: There is mild fat stranding around the prostate and seminal vesicles.  Other: There is no ascites or free air. Musculoskeletal: There is no acute osseous abnormality or suspicious osseous lesion. IMPRESSION: 1. Mild stranding around the prostate and seminal vesicles suggestive of acute prostatitis. 2. No other acute finding in the abdomen or pelvis. Electronically Signed   By: Lesia Hausen M.D.   On: 03/05/2023 15:56    Microbiology: Results for orders placed or performed during the hospital encounter of 03/05/23  Urine Culture     Status: Abnormal   Collection Time: 03/05/23 11:48 AM   Specimen: Urine, Random  Result Value Ref Range Status   Specimen Description   Final    URINE, RANDOM Performed at [facility], [address], [place], [place] [address]    Special Requests   Final    NONE Reflexed from [id number] Performed at [facility], [address] [place], [place] [address]    Culture >=100,000 COLONIES/mL KLEBSIELLA PNEUMONIAE (A)  Final   Report Status 03/07/2023 FINAL  Final   Organism ID, Bacteria KLEBSIELLA PNEUMONIAE (A)  Final      Susceptibility   Klebsiella pneumoniae - MIC*    AMPICILLIN >=32 RESISTANT Resistant     CEFAZOLIN <=4 SENSITIVE Sensitive     CEFEPIME <=0.12 SENSITIVE Sensitive     CEFTRIAXONE <=0.25 SENSITIVE Sensitive     CIPROFLOXACIN <=0.25 SENSITIVE Sensitive     GENTAMICIN <=1 SENSITIVE Sensitive     IMIPENEM <=0.25 SENSITIVE Sensitive     NITROFURANTOIN 64 INTERMEDIATE  Intermediate     TRIMETH/SULFA <=20 SENSITIVE Sensitive     AMPICILLIN/SULBACTAM 4 SENSITIVE Sensitive     PIP/TAZO <=4 SENSITIVE Sensitive ug/mL    * >=100,000 COLONIES/mL KLEBSIELLA PNEUMONIAE  Culture, blood (routine x 2)     Status: None (Preliminary result)   Collection Time: 03/05/23  1:05 PM   Specimen: BLOOD  Result Value Ref Range Status   Specimen Description BLOOD RIGHT ANTECUBITAL  Final   Special Requests   Final    BOTTLES DRAWN AEROBIC AND ANAEROBIC Blood Culture adequate volume   Culture   Final    NO GROWTH 2  DAYS Performed at [facility], [address], [place], [place] [address]    Report Status PENDING  Incomplete  Culture, blood (routine x 2)     Status: None (Preliminary result)   Collection Time: 03/05/23  5:15 PM   Specimen: BLOOD  Result Value Ref Range Status   Specimen Description BLOOD BLOOD LEFT HAND  Final   Special Requests   Final    BOTTLES DRAWN AEROBIC AND ANAEROBIC Blood Culture adequate volume   Culture   Final    NO GROWTH 2 DAYS Performed at [facility], [address], [place], [place] [address]    Report Status PENDING  Incomplete  SARS Coronavirus 2 by RT PCR (hospital order, performed in [facility] hospital lab) *cepheid single result test* Anterior Nasal Swab     Status: None   Collection Time: 03/06/23  3:28 PM   Specimen: Anterior Nasal Swab  Result Value Ref Range Status   SARS Coronavirus 2 by RT PCR NEGATIVE NEGATIVE Final    Comment: (NOTE) SARS-CoV-2 target nucleic acids are NOT DETECTED.  The SARS-CoV-2 RNA is generally detectable in upper and lower respiratory specimens during the acute phase of infection. The lowest concentration of SARS-CoV-2 viral copies this assay can detect is 250 copies / mL. A negative result does not preclude SARS-CoV-2 infection and should not be used as the sole basis for treatment or other patient management decisions.  A negative result may occur with improper specimen collection / handling, submission of specimen other than nasopharyngeal swab, presence of viral mutation(s) within the areas targeted by this assay, and inadequate number of viral copies (<250 copies / mL). A negative result must be combined with clinical observations, patient history, and epidemiological information.  Fact Sheet for Patients:   RoadLapTop.co.za  Fact Sheet for Healthcare Providers: http://kim-miller.com/  This test is not yet approved or  cleared by the [place]  FDA and has been authorized for detection and/or diagnosis of SARS-CoV-2 by FDA under an Emergency Use Authorization (EUA).  This EUA will remain in effect (meaning this test can be used) for the duration of the COVID-19 declaration under Section 564(b)(1) of the Act, 21 U.S.C. section 360bbb-3(b)(1), unless the authorization is terminated or revoked sooner.  Performed at [facility], [address] [place], [place] [address]     Labs: CBC: Recent Labs  Lab 03/05/23 1148 03/06/23 0418 03/07/23 0625  WBC 14.5* 11.2* 8.1  NEUTROABS 11.7*  --   --   HGB 14.4 13.9 13.7  HCT 42.5 41.4 41.1  MCV 94.0 93.5 95.6  PLT 261 225 209   Basic Metabolic Panel: Recent Labs  Lab 03/05/23 1148 03/06/23 0418 03/07/23 0625  NA 133* 134* 135  K 4.0 4.5 3.7  CL 100 99 100  CO2 26 25 27   GLUCOSE 172* 146* 109*  BUN 13 13  11  CREATININE 1.25* 1.13 1.02  CALCIUM 8.3* 8.3* 8.0*   Liver Function Tests: Recent Labs  Lab 03/05/23 1148  AST 28  ALT 24  ALKPHOS 50  BILITOT 1.0  PROT 7.7  ALBUMIN 3.9   CBG: No results for input(s): "GLUCAP" in the last 168 hours.  Discharge time spent: {LESS THAN/GREATER BTDV:76160} 30 minutes.  Signed: Pennie Banter, DO [facility] 03/07/2023

## 2023-03-07 NOTE — Progress Notes (Signed)
  Progress Note   Patient: Hunter Clark XBJ:[id number] DOB: Feb 20, 1966 DOA: 03/05/2023     2 DOS: the patient was seen and examined on 03/07/2023   Brief hospital course: HPI on admission 03/05/23: "Mr. Eyden Dobie is a 57 year old male with no prior medical history, presents emergency department for chief concerns of urinary frequency, and back pain that started since Wednesday. Vitals in the ED showed temperature 101.2, respiration rate 20, heart rate of 120, blood pressure 106/82, SpO2 90% on room air. WBC 14.5, Lactic acid was 1.6. UA was positive for small leukocytes and positive for nitrates."  CT renal stone protocol showed changes of acute prostatitis without other acute findings.  Patient was started on empiric IV Rocephin and admitted for further evaluation and management, pending cultures.   Assessment and Plan:  * Pyelonephritis Acute prostatitis Urine culture +Klebsiellla Initially treated with IV Rocephin Transition to PO Augmentin to complete 10 day course (longer duration given UTI complicated by proctitis) Repeat imaging if ongoing fevers or escalating flank pain Pyridium for dysuria  Headache Tylenol or ibuprofen PRN Fioricet PRN if not relieved by above (Oxycodone for flank pain might be contributing, as opioids can cause HA) Negative for Covid-19 on 10/31  Sepsis (HCC) Sepsis POA with fever and leukocytosis in setting of UTI/prostatitis.  Lactic acid normal. --Continue antibiotics as outlined --Follow cultures --Monitor fever curve, CBC --Tylenol or ibuprofen PRN fevers  Tobacco use As needed nicotine patch   Prostatitis, acute Follow up pending -  chlamydia/gonorrhea, hiv, rpr  Back pain Suspect secondary to acute prostatitis Pain control PRN - per orders   Fever Tylenol and/or ibuprofen PRN fevers  Leukocytosis See Sepsis / Prostatitis        Subjective: Pt seen with male family member at bedside today.  He is feeling better  overall, no fever/chills.  He reports ongoing headaches, currently better after tylenol given earlier, but states they recur after about 4 hours.     Physical Exam: Vitals:   03/07/23 0210 03/07/23 0628 03/07/23 0825 03/07/23 1409  BP: 118/74 115/70 130/79 139/83  Pulse: 81 82 85 88  Resp: 18 18 17 17   Temp: 98.5 F (36.9 C) 98 F (36.7 C) 98.2 F (36.8 C) 98.3 F (36.8 C)  TempSrc: Oral     SpO2: 96% 99% 96% 98%  Weight:      Height:       General exam: awake, alert, no acute distress HEENT: moist mucus membranes, hearing grossly normal  Respiratory system: on room air, normal respiratory effort. Cardiovascular system: RRR, no pedal edema.   Gastrointestinal system: soft, NT, ND Central nervous system: A&O x 3. no gross focal neurologic deficits, normal speech Extremities: moves all, no edema, normal tone Skin: dry, intact, warm, no rashes seen Psychiatry: normal mood, congruent affect, judgement and insight appear normal   Data Reviewed:  Notable labs   WBC 14.5 >> 11.2 >> 8.1 normalized, Glucose 109, Ca 8.0  Urine culture - grew Klebsiella pneumoniae resistant to ampicillin and intermediate to macrobid  Family Communication: At bedside on rounds   Disposition: Status is: Inpatient Remains inpatient appropriate because: needs further clinical improvement, having significant recurrent headaches   Planned Discharge Destination: Home    38  Author: Pennie Banter, DO 03/07/2023 4:40 PM  For on call review [email or web address].

## 2023-03-07 NOTE — Plan of Care (Signed)

## 2023-03-10 LAB — CULTURE, BLOOD (ROUTINE X 2)
Culture: NO GROWTH
Culture: NO GROWTH
Special Requests: ADEQUATE
Special Requests: ADEQUATE
